# Patient Record
Sex: Female | Born: 1960
Health system: Southern US, Community
[De-identification: ages and names within clinical notes are randomized; demographics above are authoritative.]

## PROBLEM LIST (undated history)

## (undated) DIAGNOSIS — B009 Herpesviral infection, unspecified: Secondary | ICD-10-CM

## (undated) DIAGNOSIS — E785 Hyperlipidemia, unspecified: Secondary | ICD-10-CM

## (undated) DIAGNOSIS — R87619 Unspecified abnormal cytological findings in specimens from cervix uteri: Secondary | ICD-10-CM

## (undated) DIAGNOSIS — F419 Anxiety disorder, unspecified: Secondary | ICD-10-CM

## (undated) DIAGNOSIS — K219 Gastro-esophageal reflux disease without esophagitis: Secondary | ICD-10-CM

## (undated) DIAGNOSIS — I10 Essential (primary) hypertension: Secondary | ICD-10-CM

## (undated) DIAGNOSIS — R040 Epistaxis: Secondary | ICD-10-CM

## (undated) DIAGNOSIS — IMO0002 Reserved for concepts with insufficient information to code with codable children: Secondary | ICD-10-CM

## (undated) DIAGNOSIS — D649 Anemia, unspecified: Secondary | ICD-10-CM

## (undated) DIAGNOSIS — T7840XA Allergy, unspecified, initial encounter: Secondary | ICD-10-CM

## (undated) HISTORY — PX: OTHER SURGICAL HISTORY: SHX169

## (undated) HISTORY — DX: Anemia, unspecified: D64.9

## (undated) HISTORY — DX: Allergy, unspecified, initial encounter: T78.40XA

## (undated) HISTORY — DX: Gastro-esophageal reflux disease without esophagitis: K21.9

## (undated) HISTORY — DX: Hyperlipidemia, unspecified: E78.5

## (undated) HISTORY — PX: CHOLECYSTECTOMY: SHX55

## (undated) HISTORY — DX: Reserved for concepts with insufficient information to code with codable children: IMO0002

## (undated) HISTORY — DX: Anxiety disorder, unspecified: F41.9

## (undated) HISTORY — PX: TUBAL LIGATION: SHX77

## (undated) HISTORY — DX: Unspecified abnormal cytological findings in specimens from cervix uteri: R87.619

## (undated) HISTORY — DX: Herpesviral infection, unspecified: B00.9

---

## 2004-06-02 ENCOUNTER — Ambulatory Visit (HOSPITAL_COMMUNITY): Admission: RE | Admit: 2004-06-02 | Discharge: 2004-06-02 | Payer: Self-pay | Admitting: Obstetrics & Gynecology

## 2004-09-14 ENCOUNTER — Ambulatory Visit: Payer: Self-pay | Admitting: Cardiology

## 2004-10-28 ENCOUNTER — Ambulatory Visit: Payer: Self-pay | Admitting: Cardiology

## 2004-11-25 ENCOUNTER — Ambulatory Visit: Payer: Self-pay | Admitting: Cardiology

## 2009-09-10 ENCOUNTER — Other Ambulatory Visit: Admission: RE | Admit: 2009-09-10 | Discharge: 2009-09-10 | Payer: Self-pay | Admitting: Obstetrics & Gynecology

## 2010-06-24 ENCOUNTER — Ambulatory Visit (HOSPITAL_COMMUNITY)
Admission: RE | Admit: 2010-06-24 | Discharge: 2010-06-24 | Payer: Self-pay | Source: Home / Self Care | Attending: Obstetrics & Gynecology | Admitting: Obstetrics & Gynecology

## 2010-08-09 ENCOUNTER — Encounter: Payer: Self-pay | Admitting: Obstetrics & Gynecology

## 2010-10-29 ENCOUNTER — Other Ambulatory Visit: Payer: Self-pay | Admitting: Obstetrics & Gynecology

## 2010-10-29 ENCOUNTER — Other Ambulatory Visit (HOSPITAL_COMMUNITY)
Admission: RE | Admit: 2010-10-29 | Discharge: 2010-10-29 | Disposition: A | Discharge status: Home / Self Care | Payer: Self-pay | Source: Ambulatory Visit | Attending: Obstetrics & Gynecology | Admitting: Obstetrics & Gynecology

## 2010-10-29 DIAGNOSIS — Z01419 Encounter for gynecological examination (general) (routine) without abnormal findings: Secondary | ICD-10-CM | POA: Insufficient documentation

## 2011-08-25 ENCOUNTER — Other Ambulatory Visit: Payer: Self-pay | Admitting: Obstetrics & Gynecology

## 2011-08-25 DIAGNOSIS — Z139 Encounter for screening, unspecified: Secondary | ICD-10-CM

## 2011-08-26 ENCOUNTER — Ambulatory Visit (HOSPITAL_COMMUNITY)
Admission: RE | Admit: 2011-08-26 | Discharge: 2011-08-26 | Disposition: A | Discharge status: Home / Self Care | Payer: Self-pay | Source: Ambulatory Visit | Attending: Obstetrics & Gynecology | Admitting: Obstetrics & Gynecology

## 2011-08-26 DIAGNOSIS — Z1231 Encounter for screening mammogram for malignant neoplasm of breast: Secondary | ICD-10-CM | POA: Insufficient documentation

## 2011-08-26 DIAGNOSIS — Z139 Encounter for screening, unspecified: Secondary | ICD-10-CM

## 2011-11-11 ENCOUNTER — Other Ambulatory Visit: Payer: Self-pay | Admitting: Obstetrics & Gynecology

## 2011-11-11 ENCOUNTER — Other Ambulatory Visit (HOSPITAL_COMMUNITY)
Admission: RE | Admit: 2011-11-11 | Discharge: 2011-11-11 | Disposition: A | Discharge status: Home / Self Care | Payer: Self-pay | Source: Ambulatory Visit | Attending: Obstetrics & Gynecology | Admitting: Obstetrics & Gynecology

## 2011-11-11 DIAGNOSIS — Z01419 Encounter for gynecological examination (general) (routine) without abnormal findings: Secondary | ICD-10-CM | POA: Insufficient documentation

## 2011-11-18 ENCOUNTER — Ambulatory Visit (INDEPENDENT_AMBULATORY_CARE_PROVIDER_SITE_OTHER): Payer: Self-pay | Admitting: Otolaryngology

## 2012-05-11 ENCOUNTER — Ambulatory Visit (INDEPENDENT_AMBULATORY_CARE_PROVIDER_SITE_OTHER): Payer: Self-pay | Admitting: Otolaryngology

## 2012-05-11 DIAGNOSIS — R040 Epistaxis: Secondary | ICD-10-CM

## 2012-05-22 ENCOUNTER — Encounter (HOSPITAL_COMMUNITY): Payer: Self-pay | Admitting: Emergency Medicine

## 2012-05-22 ENCOUNTER — Emergency Department (HOSPITAL_COMMUNITY): Payer: Self-pay

## 2012-05-22 ENCOUNTER — Emergency Department (HOSPITAL_COMMUNITY)
Admission: EM | Admit: 2012-05-22 | Discharge: 2012-05-22 | Disposition: A | Discharge status: Home / Self Care | Payer: Self-pay | Attending: Emergency Medicine | Admitting: Emergency Medicine

## 2012-05-22 DIAGNOSIS — R040 Epistaxis: Secondary | ICD-10-CM | POA: Insufficient documentation

## 2012-05-22 DIAGNOSIS — E871 Hypo-osmolality and hyponatremia: Secondary | ICD-10-CM | POA: Insufficient documentation

## 2012-05-22 DIAGNOSIS — F17200 Nicotine dependence, unspecified, uncomplicated: Secondary | ICD-10-CM | POA: Insufficient documentation

## 2012-05-22 DIAGNOSIS — I10 Essential (primary) hypertension: Secondary | ICD-10-CM | POA: Insufficient documentation

## 2012-05-22 HISTORY — DX: Epistaxis: R04.0

## 2012-05-22 HISTORY — DX: Essential (primary) hypertension: I10

## 2012-05-22 LAB — CBC WITH DIFFERENTIAL/PLATELET
Basophils Absolute: 0 10*3/uL (ref 0.0–0.1)
Basophils Relative: 1 % (ref 0–1)
Eosinophils Absolute: 0.1 10*3/uL (ref 0.0–0.7)
Eosinophils Relative: 2 % (ref 0–5)
HCT: 39.8 % (ref 36.0–46.0)
Hemoglobin: 13.6 g/dL (ref 12.0–15.0)
Lymphocytes Relative: 23 % (ref 12–46)
Lymphs Abs: 1.9 10*3/uL (ref 0.7–4.0)
MCH: 30.6 pg (ref 26.0–34.0)
MCHC: 34.2 g/dL (ref 30.0–36.0)
MCV: 89.6 fL (ref 78.0–100.0)
Monocytes Absolute: 1.1 10*3/uL — ABNORMAL HIGH (ref 0.1–1.0)
Monocytes Relative: 13 % — ABNORMAL HIGH (ref 3–12)
Neutro Abs: 5.1 10*3/uL (ref 1.7–7.7)
Neutrophils Relative %: 61 % (ref 43–77)
Platelets: 377 10*3/uL (ref 150–400)
RBC: 4.44 MIL/uL (ref 3.87–5.11)
RDW: 12.2 % (ref 11.5–15.5)
WBC: 8.2 10*3/uL (ref 4.0–10.5)

## 2012-05-22 LAB — URINALYSIS, ROUTINE W REFLEX MICROSCOPIC
Bilirubin Urine: NEGATIVE
Glucose, UA: NEGATIVE mg/dL
Ketones, ur: NEGATIVE mg/dL
Leukocytes, UA: NEGATIVE
Nitrite: NEGATIVE
Protein, ur: NEGATIVE mg/dL
Specific Gravity, Urine: 1.025 (ref 1.005–1.030)
Urobilinogen, UA: 0.2 mg/dL (ref 0.0–1.0)
pH: 5.5 (ref 5.0–8.0)

## 2012-05-22 LAB — TSH: TSH: 1.373 u[IU]/mL (ref 0.350–4.500)

## 2012-05-22 LAB — BASIC METABOLIC PANEL
BUN: 16 mg/dL (ref 6–23)
CO2: 27 mEq/L (ref 19–32)
Calcium: 9.3 mg/dL (ref 8.4–10.5)
Chloride: 87 mEq/L — ABNORMAL LOW (ref 96–112)
Creatinine, Ser: 0.57 mg/dL (ref 0.50–1.10)
GFR calc Af Amer: >90 mL/min (ref >90)
GFR calc non Af Amer: >90 mL/min (ref >90)
Glucose, Bld: 106 mg/dL — ABNORMAL HIGH (ref 70–99)
Potassium: 3.5 mEq/L (ref 3.5–5.1)
Sodium: 124 mEq/L — ABNORMAL LOW (ref 135–145)

## 2012-05-22 LAB — TROPONIN I: Troponin I: <0.3 ng/mL (ref <0.30)

## 2012-05-22 LAB — URINE MICROSCOPIC-ADD ON

## 2012-05-22 MED ORDER — ACETAMINOPHEN 325 MG PO TABS
650.0000 mg | ORAL_TABLET | Freq: Once | ORAL | Status: AC
  Administered 2012-05-22: 650 mg via ORAL
  Filled 2012-05-22: qty 2

## 2012-05-22 MED ORDER — METOPROLOL SUCCINATE ER 50 MG PO TB24: 50.0000 mg | ORAL_TABLET | Freq: Every day | ORAL | Status: DC

## 2012-05-22 NOTE — ED Notes (Signed)
Pt has had problems with epistaxis x 7 months. Recently dx with hypertension and on new med. Had 50cent piece clots out of r side. Pt c/o gen weakness and just feeling tired all the time. Sob intermittant. No resp distress noted. Pt pale per daughter. No gen weakness noted. Alert/roiented.

## 2012-05-22 NOTE — ED Provider Notes (Signed)
History     CSN: [id number]  Arrival date & time 05/22/12  1700   First MD Initiated Contact with Patient 05/22/12 1704      Chief Complaint  Patient presents with  . Weakness    (Consider location/radiation/quality/duration/timing/severity/associated sxs/prior treatment) HPI.... weakness and fatigue for 2 weeks. Patient has had hypertension for 2 years but just started prescription medicine. She was initially on verapamil but this did not agree with her. She was changed to lisinopril hydrochlorothiazide 20/12.5 since past Thursday.  Her symptoms have intensified since then.  smokes half a pack per day. No alcohol. No chest pain or dyspnea.  No fever, sweats, chills, or weight loss.  Has recently seen otolaryngologists for her nosebleeds on the right side.  Past Medical History  Diagnosis Date  . Hypertension   . Epistaxis     Past Surgical History  Procedure Date  . Tubal ligation   .  funduplication     History reviewed. No pertinent family history.  History  Substance Use Topics  . Smoking status: Current Every Day Smoker  . Smokeless tobacco: Not on file  . Alcohol Use: No    OB History    Grav Para Term Preterm Abortions TAB SAB Ect Mult Living                  Review of Systems  All other systems reviewed and are negative.    Allergies  Ivp dye  Home Medications   Current Outpatient Rx  Name  Route  Sig  Dispense  Refill  . LISINOPRIL-HYDROCHLOROTHIAZIDE 20-12.5 MG PO TABS   Oral   Take 1 tablet by mouth every evening. *Takes at 5pm every evening         . SALINE NASAL MIST NA   Nasal   Place 1-2 sprays into the nose daily as needed. As needed for nose bleeds           BP 161/92  Pulse 95  Temp 97.7 F (36.5 C) (Oral)  Resp 17  Ht 5\' 4"  (1.626 m)  Wt 112 lb (50.803 kg)  BMI 19.22 kg/m2  SpO2 99%  Physical Exam  Nursing note and vitals reviewed. Constitutional: She is oriented to person, place, and time. She appears well-developed  and well-nourished.       Hypertensive  HENT:  Head: Normocephalic and atraumatic.  Eyes: Conjunctivae normal and EOM are normal. Pupils are equal, round, and reactive to light.  Neck: Normal range of motion. Neck supple.  Cardiovascular: Normal rate, regular rhythm and normal heart sounds.   Pulmonary/Chest: Effort normal and breath sounds normal.  Abdominal: Soft. Bowel sounds are normal.  Musculoskeletal: Normal range of motion.  Neurological: She is alert and oriented to person, place, and time.  Skin: Skin is warm and dry.  Psychiatric: She has a normal mood and affect.    ED Course  Procedures (including critical care time)  Labs Reviewed  CBC WITH DIFFERENTIAL - Abnormal; Notable for the following:    Monocytes Relative 13 (*)     Monocytes Absolute 1.1 (*)     All other components within normal limits  BASIC METABOLIC PANEL - Abnormal; Notable for the following:    Sodium 124 (*)     Chloride 87 (*)     Glucose, Bld 106 (*)     All other components within normal limits  URINALYSIS, ROUTINE W REFLEX MICROSCOPIC - Abnormal; Notable for the following:    Hgb urine dipstick MODERATE (*)  All other components within normal limits  URINE MICROSCOPIC-ADD ON - Abnormal; Notable for the following:    Squamous Epithelial / LPF FEW (*)     All other components within normal limits  TROPONIN I  TSH   Dg Chest 2 View  05/22/2012  *RADIOLOGY REPORT*  Clinical Data: 51 year old female with weakness.  Hypertension and shortness of breath.  CHEST - 2 VIEW  Comparison: None.  Findings: Large lung volumes.  Cardiac size at the upper limits of normal. Other mediastinal contours are within normal limits. Incidental azygos fissure.  Mild apical scarring.  No pneumothorax, pulmonary edema, pleural effusion or consolidation.  Osteopenia. Right upper quadrant surgical clips.  IMPRESSION: Pulmonary hyperinflation. No acute cardiopulmonary abnormality.   Original Report Authenticated By: Erskine Speed, M.D.      No diagnosis found.   Date: 05/22/2012  Rate: 97  Rhythm: normal sinus rhythm  QRS Axis: normal  Intervals: normal  ST/T Wave abnormalities: normal  Conduction Disutrbances: none  Narrative Interpretation: unremarkable     MDM  Screening tests negative with the exception of hyponatremia. Sodium was 124.  I suspect her blood pressure medication is contributing to this problem.   I recommended discontinuation of lisinopril/hydrochlorothiazide and changed to metoprolol extended release 50 mg daily.        Donnetta Hutching, MD 05/22/12 2042

## 2012-09-02 ENCOUNTER — Other Ambulatory Visit: Payer: Self-pay

## 2012-10-10 ENCOUNTER — Encounter: Payer: Self-pay | Admitting: Gastroenterology

## 2012-10-10 ENCOUNTER — Encounter (HOSPITAL_COMMUNITY): Payer: Self-pay | Admitting: Pharmacy Technician

## 2012-10-10 ENCOUNTER — Ambulatory Visit (INDEPENDENT_AMBULATORY_CARE_PROVIDER_SITE_OTHER): Payer: Self-pay | Admitting: Gastroenterology

## 2012-10-10 VITALS — BP 145/90 | HR 85 | Temp 97.6°F | Ht 64.0 in | Wt 123.6 lb

## 2012-10-10 DIAGNOSIS — K5900 Constipation, unspecified: Secondary | ICD-10-CM

## 2012-10-10 DIAGNOSIS — R141 Gas pain: Secondary | ICD-10-CM

## 2012-10-10 DIAGNOSIS — R140 Abdominal distension (gaseous): Secondary | ICD-10-CM

## 2012-10-10 DIAGNOSIS — R198 Other specified symptoms and signs involving the digestive system and abdomen: Secondary | ICD-10-CM

## 2012-10-10 DIAGNOSIS — R194 Change in bowel habit: Secondary | ICD-10-CM

## 2012-10-10 MED ORDER — ALIGN 4 MG PO CAPS: 4.0000 mg | ORAL_CAPSULE | Freq: Every day | ORAL | Status: DC

## 2012-10-10 MED ORDER — POLYETHYLENE GLYCOL 3350 17 GM/SCOOP PO POWD: 17.0000 g | ORAL | Status: DC

## 2012-10-10 NOTE — Progress Notes (Signed)
Faxed to PCP

## 2012-10-10 NOTE — Assessment & Plan Note (Addendum)
Bowel change worsening over the past six months. Goes 2-3 weeks without BM. C/O associated abdominal bloating/swelling. Thyroid labs okay back in 05/2012. No evidence of anemia at that time. No prior colonoscopy. Recommend colonoscopy to evaluate change in bowels. Start Miralax 17g QD to BID. She can take Dulcolax 10mg  PO, repeat three hours later and Miralax 17g every hour times five for colon purge today. Start Restora one daily for three weeks, samples provided.   Colonoscopy with Prepopik in near future.  I have discussed the risks, alternatives, benefits with regards to but not limited to the risk of reaction to medication, bleeding, infection, perforation and the patient is agreeable to proceed. Written consent to be obtained.  Retrieve copy of last EGD for records.

## 2012-10-10 NOTE — Progress Notes (Signed)
Primary Care Physician:  Toma Deiters, MD  Primary Gastroenterologist:  Roetta Sessions, MD   Chief Complaint  Patient presents with  . Abdominal Pain    HPI:  Suzanne Morton is a 52 y.o. female here for further evaluation of abdominal swelling/constipation. Seen previously by Dr. Jena Gauss over ten years ago. Records from hospital requested. Patient reports prior EGD, esophageal manometry and fundoplication. Denies prior colonoscopy.  C/O change in bowels over past couple of years. Really bad the last 6 months. Rarely has diarrhea. Usually goes 2-3 weeks without BMS. Stomach makes lot of noise. C/O fatigue and headaches. Went to pharmacy and was advised to take Miralax. Took two capfuls BID for two days so far with small amount of results. No melena, brbpr. C/O bloating that is worse as the day progresses. No belching since fundoplication, no vomiting. No dysphagia. No heartburn, no melena, brbpr, no weight loss. Rarely uses NSAIDs/ASA.   Current Outpatient Prescriptions  Medication Sig Dispense Refill  . metoprolol succinate (TOPROL-XL) 50 MG 24 hr tablet Take 1 tablet (50 mg total) by mouth daily. Take with or immediately following a meal.  30 tablet  1  . SALINE NASAL MIST NA Place 1-2 sprays into the nose daily as needed. As needed for nose bleeds      . lisinopril-hydrochlorothiazide (PRINZIDE,ZESTORETIC) 20-12.5 MG per tablet Take 1 tablet by mouth every evening. *Takes at 5pm every evening       No current facility-administered medications for this visit.    Allergies as of 10/10/2012 - Review Complete 10/10/2012  Allergen Reaction Noted  . Ivp dye (iodinated diagnostic agents) Hives, Shortness Of Breath, and Swelling 05/22/2012    Past Medical History  Diagnosis Date  . Hypertension   . Epistaxis     Past Surgical History  Procedure Laterality Date  . Tubal ligation    .  funduplication    . Cholecystectomy      Dr. Leona Carry    Family History  Problem Relation Age of  Onset  . Colon cancer Neg Hx   . Lung cancer Mother   . Alcoholism Father     History   Social History  . Marital Status: Divorced    Spouse Name: N/A    Number of Children: 2  . Years of Education: N/A   Occupational History  . Medical illustrator    Social History Main Topics  . Smoking status: Current Every Day Smoker  . Smokeless tobacco: Not on file  . Alcohol Use: No  . Drug Use: No  . Sexually Active:    Other Topics Concern  . Not on file   Social History Narrative  . No narrative on file      ROS:  General: Negative for anorexia, weight loss, fever, chills. C/O fatigue. Eyes: Negative for vision changes.  ENT: Negative for hoarseness, difficulty swallowing , nasal congestion. CV: Negative for chest pain, angina, palpitations, dyspnea on exertion, peripheral edema.  Respiratory: Negative for dyspnea at rest, dyspnea on exertion, cough, sputum, wheezing.  GI: See history of present illness. GU:  Negative for dysuria, hematuria, urinary incontinence, urinary frequency, nocturnal urination.  MS: Negative for joint pain, low back pain.  Derm: Negative for rash or itching.  Neuro: Negative for weakness, abnormal sensation, seizure, frequent headaches, memory loss, confusion.  Psych: Negative for anxiety, depression, suicidal ideation, hallucinations.  Endo: Negative for unusual weight change.  Heme: Negative for bruising or bleeding. Allergy: Negative for rash or hives.  Physical Examination:  BP 145/90  Pulse 85  Temp(Src) 97.6 F (36.4 C) (Oral)  Ht 5\' 4"  (1.626 m)  Wt 123 lb 9.6 oz (56.065 kg)  BMI 21.21 kg/m2   General: Well-nourished, well-developed in no acute distress.  Head: Normocephalic, atraumatic.   Eyes: Conjunctiva pink, no icterus. Mouth: Oropharyngeal mucosa moist and pink , no lesions erythema or exudate. Neck: Supple without thyromegaly, masses, or lymphadenopathy.  Lungs: Clear to auscultation bilaterally.  Heart: Regular  rate and rhythm, no murmurs rubs or gallops.  Abdomen: Bowel sounds are normal, nontender, nondistended, no hepatosplenomegaly or masses, no abdominal bruits or hernia , no rebound or guarding.   Rectal: not performed Extremities: No lower extremity edema. No clubbing or deformities.  Neuro: Alert and oriented x 4 , grossly normal neurologically.  Skin: Warm and dry, no rash or jaundice.   Psych: Alert and cooperative, normal mood and affect.  Labs: Lab Results  Component Value Date   TSH 1.373 05/22/2012   Lab Results  Component Value Date   CREATININE 0.57 05/22/2012   BUN 16 05/22/2012   NA 124* 05/22/2012   K 3.5 05/22/2012   CL 87* 05/22/2012   CO2 27 05/22/2012   Lab Results  Component Value Date   WBC 8.2 05/22/2012   HGB 13.6 05/22/2012   HCT 39.8 05/22/2012   MCV 89.6 05/22/2012   PLT 377 05/22/2012    Imaging Studies: No results found.

## 2012-10-10 NOTE — Patient Instructions (Addendum)
In the next one to 2 days at your convenience, performed the following bowel regimen. #1 Dulcolax, take 2 tablets. #2 One hour later begin MiraLax 1 cap full every hour for 5 hours. #3 After your third dose of MiraLax, take 2 additional Dulcolax tablets. #4 Continue MiraLax one capful once or twice daily as needed.  We have scheduled you for a colonoscopy. We have provided you with a sample of Prepopik to take before this procedure. Please fill out patient assistance for as soon as possible.  Take Align (probiotic) once daily for three weeks. Samples provided.

## 2012-10-23 ENCOUNTER — Encounter (HOSPITAL_COMMUNITY)
Admission: RE | Disposition: A | Discharge status: Home / Self Care | Payer: Self-pay | Source: Ambulatory Visit | Attending: Internal Medicine

## 2012-10-23 ENCOUNTER — Ambulatory Visit (HOSPITAL_COMMUNITY)
Admission: RE | Admit: 2012-10-23 | Discharge: 2012-10-23 | Disposition: A | Discharge status: Home / Self Care | Payer: Self-pay | Source: Ambulatory Visit | Attending: Internal Medicine | Admitting: Internal Medicine

## 2012-10-23 ENCOUNTER — Encounter (HOSPITAL_COMMUNITY): Payer: Self-pay

## 2012-10-23 ENCOUNTER — Encounter: Payer: Self-pay | Admitting: *Deleted

## 2012-10-23 DIAGNOSIS — F17200 Nicotine dependence, unspecified, uncomplicated: Secondary | ICD-10-CM | POA: Insufficient documentation

## 2012-10-23 DIAGNOSIS — K5900 Constipation, unspecified: Secondary | ICD-10-CM

## 2012-10-23 DIAGNOSIS — B009 Herpesviral infection, unspecified: Secondary | ICD-10-CM

## 2012-10-23 DIAGNOSIS — I10 Essential (primary) hypertension: Secondary | ICD-10-CM | POA: Insufficient documentation

## 2012-10-23 DIAGNOSIS — Z91041 Radiographic dye allergy status: Secondary | ICD-10-CM | POA: Insufficient documentation

## 2012-10-23 DIAGNOSIS — R194 Change in bowel habit: Secondary | ICD-10-CM

## 2012-10-23 DIAGNOSIS — Z79899 Other long term (current) drug therapy: Secondary | ICD-10-CM | POA: Insufficient documentation

## 2012-10-23 HISTORY — PX: COLONOSCOPY: SHX5424

## 2012-10-23 SURGERY — COLONOSCOPY
Anesthesia: Moderate Sedation

## 2012-10-23 MED ORDER — MIDAZOLAM HCL 5 MG/5ML IJ SOLN
INTRAMUSCULAR | Status: DC | PRN
  Administered 2012-10-23: 1 mg via INTRAVENOUS
  Administered 2012-10-23: 2 mg via INTRAVENOUS
  Administered 2012-10-23: 1 mg via INTRAVENOUS

## 2012-10-23 MED ORDER — MIDAZOLAM HCL 5 MG/5ML IJ SOLN
INTRAMUSCULAR | Status: AC
  Filled 2012-10-23: qty 10

## 2012-10-23 MED ORDER — ONDANSETRON HCL 4 MG/2ML IJ SOLN
INTRAMUSCULAR | Status: DC | PRN
  Administered 2012-10-23: 4 mg via INTRAVENOUS

## 2012-10-23 MED ORDER — STERILE WATER FOR IRRIGATION IR SOLN
Status: DC | PRN
  Administered 2012-10-23: 09:00:00

## 2012-10-23 MED ORDER — SODIUM CHLORIDE 0.9 % IV SOLN
INTRAVENOUS | Status: DC
  Administered 2012-10-23: 09:00:00 via INTRAVENOUS

## 2012-10-23 MED ORDER — ONDANSETRON HCL 4 MG/2ML IJ SOLN
INTRAMUSCULAR | Status: AC
  Filled 2012-10-23: qty 2

## 2012-10-23 MED ORDER — MEPERIDINE HCL 100 MG/ML IJ SOLN
INTRAMUSCULAR | Status: AC
  Filled 2012-10-23: qty 2

## 2012-10-23 MED ORDER — MEPERIDINE HCL 100 MG/ML IJ SOLN
INTRAMUSCULAR | Status: DC | PRN
  Administered 2012-10-23: 50 mg via INTRAVENOUS
  Administered 2012-10-23: 25 mg via INTRAVENOUS

## 2012-10-23 NOTE — H&P (View-Only) (Signed)
Primary Care Physician:  HASANAJ,XAJE A, MD  Primary Gastroenterologist:  Michael Rourk, MD   Chief Complaint  Patient presents with  . Abdominal Pain    HPI:  Suzanne Morton is a 51 y.o. female here for further evaluation of abdominal swelling/constipation. Seen previously by Dr. Rourk over ten years ago. Records from hospital requested. Patient reports prior EGD, esophageal manometry and fundoplication. Denies prior colonoscopy.  C/O change in bowels over past couple of years. Really bad the last 6 months. Rarely has diarrhea. Usually goes 2-3 weeks without BMS. Stomach makes lot of noise. C/O fatigue and headaches. Went to pharmacy and was advised to take Miralax. Took two capfuls BID for two days so far with small amount of results. No melena, brbpr. C/O bloating that is worse as the day progresses. No belching since fundoplication, no vomiting. No dysphagia. No heartburn, no melena, brbpr, no weight loss. Rarely uses NSAIDs/ASA.   Current Outpatient Prescriptions  Medication Sig Dispense Refill  . metoprolol succinate (TOPROL-XL) 50 MG 24 hr tablet Take 1 tablet (50 mg total) by mouth daily. Take with or immediately following a meal.  30 tablet  1  . SALINE NASAL MIST NA Place 1-2 sprays into the nose daily as needed. As needed for nose bleeds      . lisinopril-hydrochlorothiazide (PRINZIDE,ZESTORETIC) 20-12.5 MG per tablet Take 1 tablet by mouth every evening. *Takes at 5pm every evening       No current facility-administered medications for this visit.    Allergies as of 10/10/2012 - Review Complete 10/10/2012  Allergen Reaction Noted  . Ivp dye (iodinated diagnostic agents) Hives, Shortness Of Breath, and Swelling 05/22/2012    Past Medical History  Diagnosis Date  . Hypertension   . Epistaxis     Past Surgical History  Procedure Laterality Date  . Tubal ligation    .  funduplication    . Cholecystectomy      Dr. Destefano    Family History  Problem Relation Age of  Onset  . Colon cancer Neg Hx   . Lung cancer Mother   . Alcoholism Father     History   Social History  . Marital Status: Divorced    Spouse Name: N/A    Number of Children: 2  . Years of Education: N/A   Occupational History  . Financial office manager    Social History Main Topics  . Smoking status: Current Every Day Smoker  . Smokeless tobacco: Not on file  . Alcohol Use: No  . Drug Use: No  . Sexually Active:    Other Topics Concern  . Not on file   Social History Narrative  . No narrative on file      ROS:  General: Negative for anorexia, weight loss, fever, chills. C/O fatigue. Eyes: Negative for vision changes.  ENT: Negative for hoarseness, difficulty swallowing , nasal congestion. CV: Negative for chest pain, angina, palpitations, dyspnea on exertion, peripheral edema.  Respiratory: Negative for dyspnea at rest, dyspnea on exertion, cough, sputum, wheezing.  GI: See history of present illness. GU:  Negative for dysuria, hematuria, urinary incontinence, urinary frequency, nocturnal urination.  MS: Negative for joint pain, low back pain.  Derm: Negative for rash or itching.  Neuro: Negative for weakness, abnormal sensation, seizure, frequent headaches, memory loss, confusion.  Psych: Negative for anxiety, depression, suicidal ideation, hallucinations.  Endo: Negative for unusual weight change.  Heme: Negative for bruising or bleeding. Allergy: Negative for rash or hives.      Physical Examination:  BP 145/90  Pulse 85  Temp(Src) 97.6 F (36.4 C) (Oral)  Ht 5' 4" (1.626 m)  Wt 123 lb 9.6 oz (56.065 kg)  BMI 21.21 kg/m2   General: Well-nourished, well-developed in no acute distress.  Head: Normocephalic, atraumatic.   Eyes: Conjunctiva pink, no icterus. Mouth: Oropharyngeal mucosa moist and pink , no lesions erythema or exudate. Neck: Supple without thyromegaly, masses, or lymphadenopathy.  Lungs: Clear to auscultation bilaterally.  Heart: Regular  rate and rhythm, no murmurs rubs or gallops.  Abdomen: Bowel sounds are normal, nontender, nondistended, no hepatosplenomegaly or masses, no abdominal bruits or hernia , no rebound or guarding.   Rectal: not performed Extremities: No lower extremity edema. No clubbing or deformities.  Neuro: Alert and oriented x 4 , grossly normal neurologically.  Skin: Warm and dry, no rash or jaundice.   Psych: Alert and cooperative, normal mood and affect.  Labs: Lab Results  Component Value Date   TSH 1.373 05/22/2012   Lab Results  Component Value Date   CREATININE 0.57 05/22/2012   BUN 16 05/22/2012   NA 124* 05/22/2012   K 3.5 05/22/2012   CL 87* 05/22/2012   CO2 27 05/22/2012   Lab Results  Component Value Date   WBC 8.2 05/22/2012   HGB 13.6 05/22/2012   HCT 39.8 05/22/2012   MCV 89.6 05/22/2012   PLT 377 05/22/2012    Imaging Studies: No results found.    

## 2012-10-23 NOTE — Interval H&P Note (Signed)
History and Physical Interval Note:  10/23/2012 9:22 AM  Suzanne Morton  has presented today for surgery, with the diagnosis of Constipation Change in bowels  The various methods of treatment have been discussed with the patient and family. After consideration of risks, benefits and other options for treatment, the patient has consented to  Procedure(s) with comments: COLONOSCOPY (N/A) - 9:15 as a surgical intervention .  The patient's history has been reviewed, patient examined, no change in status, stable for surgery.  I have reviewed the patient's chart and labs.  Questions were answered to the patient's satisfaction.     Colonoscopy per plan.The risks, benefits, limitations, alternatives and imponderables have been reviewed with the patient. Questions have been answered. All parties are agreeable.   Eula Listen

## 2012-10-23 NOTE — Discharge Summary (Signed)
   Colonoscopy Discharge Instructions  Read the instructions outlined below and refer to this sheet in the next few weeks. These discharge instructions provide you with general information on caring for yourself after you leave the hospital. Your doctor may also give you specific instructions. While your treatment has been planned according to the most current medical practices available, unavoidable complications occasionally occur. If you have any problems or questions after discharge, call Dr. Jena Gauss at [phone]. ACTIVITY  You may resume your regular activity, but move at a slower pace for the next 24 hours.   Take frequent rest periods for the next 24 hours.   Walking will help get rid of the air and reduce the bloated feeling in your belly (abdomen).   No driving for 24 hours (because of the medicine (anesthesia) used during the test).    Do not sign any important legal documents or operate any machinery for 24 hours (because of the anesthesia used during the test).  NUTRITION  Drink plenty of fluids.   You may resume your normal diet as instructed by your doctor.   Begin with a light meal and progress to your normal diet. Heavy or fried foods are harder to digest and may make you feel sick to your stomach (nauseated).   Avoid alcoholic beverages for 24 hours or as instructed.  MEDICATIONS  You may resume your normal medications unless your doctor tells you otherwise.  WHAT YOU CAN EXPECT TODAY  Some feelings of bloating in the abdomen.   Passage of more gas than usual.   Spotting of blood in your stool or on the toilet paper.  IF YOU HAD POLYPS REMOVED DURING THE COLONOSCOPY:  No aspirin products for 7 days or as instructed.   No alcohol for 7 days or as instructed.   Eat a soft diet for the next 24 hours.  FINDING OUT THE RESULTS OF YOUR TEST Not all test results are available during your visit. If your test results are not back during the visit, make an appointment  with your caregiver to find out the results. Do not assume everything is normal if you have not heard from your caregiver or the medical facility. It is important for you to follow up on all of your test results.  SEEK IMMEDIATE MEDICAL ATTENTION IF:  You have more than a spotting of blood in your stool.   Your belly is swollen (abdominal distention).   You are nauseated or vomiting.   You have a temperature over 101.   You have abdominal pain or discomfort that is severe or gets worse throughout the day.   Constipation information provided  Use MiraLax 34 g daily either as a single dose or 17 g orally twice daily to achieve a bowel movement every other day or 3 times weekly.  Repeat screening colonoscopy in 10 years

## 2012-10-23 NOTE — Op Note (Signed)
[facility] [address] [place], [address]   COLONOSCOPY PROCEDURE REPORT  PATIENT: Suzanne Morton, Suzanne Morton  MR#:         [id number] BIRTHDATE: 1960/09/10 , 52  yrs. old GENDER: Female ENDOSCOPIST: R.  Roetta Sessions, MD FACP FACG REFERRED BY:  Lia Hopping, M.D. PROCEDURE DATE:  10/23/2012 PROCEDURE:     screening ileo-colonoscopy  INDICATIONS:   Average risk screening examination/constipation  INFORMED CONSENT:  The risks, benefits, alternatives and imponderables including but not limited to bleeding, perforation as well as the possibility of a missed lesion have been reviewed.  The potential for biopsy, lesion removal, etc. have also been discussed.  Questions have been answered.  All parties agreeable. Please see the history and physical in the medical record for more information.  MEDICATIONS: Versed 5 mg IV and Demerol 75 mg IV in divided doses. Zofran 4 mg IV  DESCRIPTION OF PROCEDURE:  After a digital rectal exam was performed, the EC-[id number] ([id number])  colonoscope was advanced from the anus through the rectum and colon to the area of the cecum, ileocecal valve and appendiceal orifice.  The cecum was deeply intubated.  These structures were well-seen and photographed for the record.  From the level of the cecum and ileocecal valve, the scope was slowly and cautiously withdrawn.  The mucosal surfaces were carefully surveyed utilizing scope tip deflection to facilitate fold flattening as needed.  The scope was pulled down into the rectum where a thorough examination including retroflexion was performed.    FINDINGS:  Adequate preparation.  Anal papilla; otherwise, normal rectum. Normal-appearing colonic mucosa appeared Normal-appearing distal 10 cm of terminal ileal mucosa  THERAPEUTIC / DIAGNOSTIC MANEUVERS PERFORMED:  None  COMPLICATIONS: None  CECAL WITHDRAWAL TIME:  8 minutes  IMPRESSION:  Normal rectum (anal papilla), colon and terminal  ileum  RECOMMENDATIONS: Continue bowel regimen previously recommended. Specifically, would increase MiraLax to 34 g once daily or 17 g orally twice daily to achieve at least 3 bowel movements weekly or one bowel movement every other day.   _______________________________ eSigned:  R. Roetta Sessions, MD FACP [facility] 10/23/2012 10:03 AM   CC:

## 2012-10-24 ENCOUNTER — Ambulatory Visit: Payer: Self-pay | Admitting: Obstetrics & Gynecology

## 2012-10-26 ENCOUNTER — Encounter (HOSPITAL_COMMUNITY): Payer: Self-pay | Admitting: Internal Medicine

## 2012-11-22 ENCOUNTER — Other Ambulatory Visit: Payer: Self-pay | Admitting: Obstetrics & Gynecology

## 2012-11-28 ENCOUNTER — Encounter: Payer: Self-pay | Admitting: Obstetrics & Gynecology

## 2012-11-28 ENCOUNTER — Ambulatory Visit (INDEPENDENT_AMBULATORY_CARE_PROVIDER_SITE_OTHER): Payer: Self-pay | Admitting: Obstetrics & Gynecology

## 2012-11-28 VITALS — BP 100/70 | Ht 63.0 in | Wt 123.0 lb

## 2012-11-28 DIAGNOSIS — Z01419 Encounter for gynecological examination (general) (routine) without abnormal findings: Secondary | ICD-10-CM

## 2012-11-28 DIAGNOSIS — Z1212 Encounter for screening for malignant neoplasm of rectum: Secondary | ICD-10-CM

## 2012-11-28 NOTE — Patient Instructions (Signed)

## 2012-11-28 NOTE — Progress Notes (Signed)
Patient ID: Suzanne Morton, female   DOB: [birth year], 52 y.o.   MRN: [id number] Subjective:     Suzanne Morton is a 52 y.o. female here for a routine exam.  No LMP recorded. Patient is postmenopausal. G2P2 Current complaints: none.  Personal health questionnaire reviewed: yes.   Gynecologic History No LMP recorded. Patient is postmenopausal. Contraception: post menopausal status Last Pap: 2013. Results were: normal Last mammogram: 2013. Results were: normal  Obstetric History OB History   Grav Para Term Preterm Abortions TAB SAB Ect Mult Living   2 2        2      # Outc Date GA Lbr Len/2nd Wgt Sex Del Anes PTL Lv   1 PAR      SVD   Yes   2 PAR      SVD   Yes       The following portions of the patient's history were reviewed and updated as appropriate: allergies, current medications, past family history, past medical history, past social history, past surgical history and problem list.  Review of Systems  Review of Systems  Constitutional: Negative for fever, chills, weight loss, malaise/fatigue and diaphoresis.  HENT: Negative for hearing loss, ear pain, nosebleeds, congestion, sore throat, neck pain, tinnitus and ear discharge.   Eyes: Negative for blurred vision, double vision, photophobia, pain, discharge and redness.  Respiratory: Negative for cough, hemoptysis, sputum production, shortness of breath, wheezing and stridor.   Cardiovascular: Negative for chest pain, palpitations, orthopnea, claudication, leg swelling and PND.  Gastrointestinal: negative for abdominal pain. Negative for heartburn, nausea, vomiting, diarrhea, constipation, blood in stool and melena.  Genitourinary: Negative for dysuria, urgency, frequency, hematuria and flank pain.  Musculoskeletal: Negative for myalgias, back pain, joint pain and falls.  Skin: Negative for itching and rash.  Neurological: Negative for dizziness, tingling, tremors, sensory change, speech change, focal weakness, seizures, loss of  consciousness, weakness and headaches.  Endo/Heme/Allergies: Negative for environmental allergies and polydipsia. Does not bruise/bleed easily.  Psychiatric/Behavioral: Negative for depression, suicidal ideas, hallucinations, memory loss and substance abuse. The patient is not nervous/anxious and does not have insomnia.        Objective:    Physical Exam  Vitals reviewed. Constitutional: She is oriented to person, place, and time. She appears well-developed and well-nourished.  HENT:  Head: Normocephalic and atraumatic.        Right Ear: External ear normal.  Left Ear: External ear normal.  Nose: Nose normal.  Mouth/Throat: Oropharynx is clear and moist.  Eyes: Conjunctivae and EOM are normal. Pupils are equal, round, and reactive to light. Right eye exhibits no discharge. Left eye exhibits no discharge. No scleral icterus.  Neck: Normal range of motion. Neck supple. No tracheal deviation present. No thyromegaly present.  Cardiovascular: Normal rate, regular rhythm, normal heart sounds and intact distal pulses.  Exam reveals no gallop and no friction rub.   No murmur heard. Respiratory: Effort normal and breath sounds normal. No respiratory distress. She has no wheezes. She has no rales. She exhibits no tenderness.  GI: Soft. Bowel sounds are normal. She exhibits no distension and no mass. There is no tenderness. There is no rebound and no guarding.  Genitourinary:       Vulva is normal without lesions Vagina is pink moist without discharge Cervix normal in appearance and pap is done Uterus is normal size shape and contour Adnexa is negative with normal sized ovaries  Rectal no masses heme occult negative normal tone  Musculoskeletal: Normal range of motion. She exhibits no edema and no tenderness.  Neurological: She is alert and oriented to person, place, and time. She has normal reflexes. She displays normal reflexes. No cranial nerve deficit. She exhibits normal muscle tone.  Coordination normal.  Skin: Skin is warm and dry. No rash noted. No erythema. No pallor.  Psychiatric: She has a normal mood and affect. Her behavior is normal. Judgment and thought content normal.       Assessment:    Healthy female exam.    Plan:    Follow up in: 1 year.

## 2012-12-07 ENCOUNTER — Telehealth: Payer: Self-pay | Admitting: Internal Medicine

## 2012-12-07 NOTE — Telephone Encounter (Signed)
Could try course of Linzess 290 mcg capsules one a day x14-samples - okay.

## 2012-12-07 NOTE — Telephone Encounter (Signed)
Pt aware, samples and pt assistance forms at the front desk. Pt will fill out paperwork and bring it back if the medication works well for her.

## 2012-12-07 NOTE — Telephone Encounter (Signed)
Pt called to say that she is still having stomach troubles since having her tcs. She said the restora samples are not helping her and she is drinking 4 cups of metamucil a day. Please advise and call her at [phone]

## 2012-12-07 NOTE — Telephone Encounter (Signed)
Spoke with pt- she is taking miralax 4 x a day (not metamucil) and having very little results. She has a lot of gas and swelling of her stomach and a little bit of nausea but she doesn't feel any pressure in her rectum like she would need to have a BM. Her stools have been very small. She is drinking plenty of fluids. Pt stated that she is miserable. She wants to know if there is anything she can do. She doesn't have any Rx insurance.

## 2012-12-12 ENCOUNTER — Telehealth: Payer: Self-pay

## 2012-12-12 NOTE — Telephone Encounter (Signed)
She likely needs both Miralax and Linzess due to the severity of her constipation. It is too early to call Linzess a failure.  If she is willing, I would recommend she continue Linzess and add back Miralax two capfuls per day.   Otherwise, would offer her amitiza BID with food and continue Miralax two capfuls per day. Offer her samples for two weeks.

## 2012-12-12 NOTE — Telephone Encounter (Signed)
She is already doing Miralax four times a day with the Linzess, Please advise

## 2012-12-12 NOTE — Telephone Encounter (Signed)
Pt called to let us know that the Linzess is making her stomach cramp. She had a BM yesterday but very little her last good BM was 5 days ago. I told her to hold the medication for now. She feels very bloated. Please advise

## 2012-12-13 NOTE — Telephone Encounter (Signed)
Pt is aware to come by and get samples.

## 2012-12-13 NOTE — Telephone Encounter (Signed)
Tried to call with no answer  

## 2012-12-13 NOTE — Telephone Encounter (Signed)
She should not be taking four doses of Miralax daily on regular basis.   Continue Miralax BID or two capfuls ONCE daily. Switch to Amitiza BID with food. Give two weeks of samples. STOP Linzess.

## 2012-12-19 ENCOUNTER — Telehealth: Payer: Self-pay | Admitting: Obstetrics & Gynecology

## 2012-12-19 DIAGNOSIS — Z01419 Encounter for gynecological examination (general) (routine) without abnormal findings: Secondary | ICD-10-CM

## 2012-12-19 NOTE — Telephone Encounter (Signed)
Pt notified of WNL pap from 11/28/2012

## 2013-01-06 NOTE — Progress Notes (Signed)
REVIEWED.  

## 2013-04-12 ENCOUNTER — Encounter: Payer: Self-pay | Admitting: Internal Medicine

## 2013-04-12 ENCOUNTER — Other Ambulatory Visit: Payer: Self-pay | Admitting: Internal Medicine

## 2013-04-12 ENCOUNTER — Ambulatory Visit (INDEPENDENT_AMBULATORY_CARE_PROVIDER_SITE_OTHER): Payer: Self-pay | Admitting: Internal Medicine

## 2013-04-12 VITALS — BP 148/81 | HR 74 | Temp 98.4°F | Ht 64.0 in | Wt 122.2 lb

## 2013-04-12 DIAGNOSIS — R1011 Right upper quadrant pain: Secondary | ICD-10-CM

## 2013-04-12 DIAGNOSIS — G8929 Other chronic pain: Secondary | ICD-10-CM

## 2013-04-12 DIAGNOSIS — K5900 Constipation, unspecified: Secondary | ICD-10-CM

## 2013-04-12 DIAGNOSIS — R1013 Epigastric pain: Secondary | ICD-10-CM

## 2013-04-12 LAB — HEPATIC FUNCTION PANEL
ALT: 16 U/L (ref 0–35)
AST: 19 U/L (ref 0–37)
Bilirubin, Direct: <0.1 mg/dL (ref 0.0–0.3)

## 2013-04-12 LAB — CBC WITH DIFFERENTIAL/PLATELET
Basophils Absolute: 0.1 10*3/uL (ref 0.0–0.1)
Basophils Relative: 1 % (ref 0–1)
Eosinophils Absolute: 0.1 10*3/uL (ref 0.0–0.7)
Eosinophils Relative: 1 % (ref 0–5)
MCH: 29.8 pg (ref 26.0–34.0)
MCV: 87.6 fL (ref 78.0–100.0)
Platelets: 409 10*3/uL — ABNORMAL HIGH (ref 150–400)
RDW: 13.8 % (ref 11.5–15.5)
WBC: 8.5 10*3/uL (ref 4.0–10.5)

## 2013-04-12 NOTE — Patient Instructions (Addendum)
Constipation information  Begin Linzess 145 - once daily  LFTs, CBC, UA, TSH  RUQ ultrasound for RUQ pain  Keep a stool diary  Telephone follow-up

## 2013-04-12 NOTE — Progress Notes (Signed)
Primary Care Physician:  Toma Deiters, MD Primary Gastroenterologist:  Dr. Jena Gauss  Pre-Procedure History & Physical: HPI:  Suzanne Morton is a 52 y.o. female here for followup of a chronic constipation. May go 2 weeks without a bowel movement.. Chronically bloated.[place] results with MiraLax in the past. Had a negative ileocolonoscopy earlier this year. No rectal bleeding. No reflux symptoms can't belch since she had a fundoplication previously. 5 day history of right upper quadrant abdominal pain without apparent inciting cause. Again,  no nausea or vomiting; no fever or chills -  not affected by eating;  may have a positional component. Does not remember straining. No flank pain,  no urinary tract symptoms. Gallbladder is out (biliary dyskinesia). Stop blood pressure meds on her own  recently.  Past Medical History  Diagnosis Date  . Hypertension   . Epistaxis   . HSV-2 (herpes simplex virus 2) infection   . Abnormal Pap smear     Past Surgical History  Procedure Laterality Date  . Tubal ligation    .  funduplication    . Cholecystectomy      Dr. Leona Carry  . Colonoscopy N/A 10/23/2012    Dr. Jena Gauss- normal rectum (anal papilla), colon and terminal ileum    Prior to Admission medications   Medication Sig Start Date End Date Taking? Authorizing Provider  ibuprofen (ADVIL,MOTRIN) 200 MG tablet Take 200 mg by mouth every 6 (six) hours as needed for pain.   Yes Historical Provider, MD  SALINE NASAL MIST NA Place 1-2 sprays into the nose daily as needed. As needed for nose bleeds   Yes Historical Provider, MD  metoprolol succinate (TOPROL-XL) 50 MG 24 hr tablet Take 1 tablet (50 mg total) by mouth daily. Take with or immediately following a meal. 05/22/12   Donnetta Hutching, MD  polyethylene glycol powder (GLYCOLAX/MIRALAX) powder Take 17 g by mouth daily. 10/10/12   Tiffany Kocher, PA-C  Probiotic Product (RESTORA) CAPS Take 1 capsule by mouth daily.    Historical Provider, MD    Allergies as of  04/12/2013 - Review Complete 04/12/2013  Allergen Reaction Noted  . Ivp dye [iodinated diagnostic agents] Hives, Shortness Of Breath, and Swelling 05/22/2012    Family History  Problem Relation Age of Onset  . Colon cancer Neg Hx   . Lung cancer Mother   . Alcoholism Father     History   Social History  . Marital Status: Divorced    Spouse Name: N/A    Number of Children: 2  . Years of Education: N/A   Occupational History  . Medical illustrator    Social History Main Topics  . Smoking status: Current Every Day Smoker  . Smokeless tobacco: Not on file  . Alcohol Use: No  . Drug Use: No  . Sexual Activity: Yes   Other Topics Concern  . Not on file   Social History Narrative  . No narrative on file    Review of Systems: See HPI, otherwise negative ROS  Physical Exam: BP 148/81  Pulse 74  Temp(Src) 98.4 F (36.9 C) (Oral)  Ht 5\' 4"  (1.626 m)  Wt 122 lb 3.2 oz (55.43 kg)  BMI 20.97 kg/m2 General:   Alert,  Well-developed, well-nourished, pleasant and cooperative in NAD Skin:  Intact without significant lesions or rashes. Eyes:  Sclera clear, no icterus.   Conjunctiva pink. Ears:  Normal auditory acuity. Nose:  No deformity, discharge,  or lesions. Mouth:  No deformity or lesions. Neck:  Supple; no masses or thyromegaly. No significant cervical adenopathy. Lungs:  Clear throughout to auscultation.   No wheezes, crackles, or rhonchi. No acute distress. Heart:  Regular rate and rhythm; no murmurs, clicks, rubs,  or gallops. Abdomen: Non-distended, normal bowel sounds.  Soft, she does have right upper quadrant tenderness. No appreciable mass or hepatosplenomegaly. No CVA tenderness.  Pulses:  Normal pulses noted. Extremities:  Without clubbing or edema.  Impression:  52 year old lady with chronic lifelong constipation. She does not always get an urge to have a bowel movement. Suspect she has more of colonic inertia than a functional outlet obstruction.  Blood  pressure a bit today. Came off of her medications on her own. Not really doing that well with MiraLax these days. Right upper quadrant abdominal pain and new problem. Maybe a musculoskeletal strain. Doubt more visceral process.   Recommendations:  Constipation information  Begin Linzess 145 - once daily  LFTs, CBC, UA, TSH  RUQ ultrasound for RUQ pain  Keep a stool diary  Telephone follow-up

## 2013-04-13 LAB — URINALYSIS W MICROSCOPIC + REFLEX CULTURE
Bacteria, UA: NONE SEEN
Bilirubin Urine: NEGATIVE
Crystals: NONE SEEN
Ketones, ur: NEGATIVE mg/dL
Protein, ur: NEGATIVE mg/dL
Urobilinogen, UA: 0.2 mg/dL (ref 0.0–1.0)

## 2013-04-16 ENCOUNTER — Ambulatory Visit (HOSPITAL_COMMUNITY)
Admission: RE | Admit: 2013-04-16 | Discharge: 2013-04-16 | Disposition: A | Discharge status: Home / Self Care | Payer: Self-pay | Source: Ambulatory Visit | Attending: Internal Medicine | Admitting: Internal Medicine

## 2013-04-16 DIAGNOSIS — K838 Other specified diseases of biliary tract: Secondary | ICD-10-CM | POA: Insufficient documentation

## 2013-04-16 DIAGNOSIS — R1013 Epigastric pain: Secondary | ICD-10-CM | POA: Insufficient documentation

## 2013-04-16 DIAGNOSIS — R109 Unspecified abdominal pain: Secondary | ICD-10-CM | POA: Insufficient documentation

## 2013-04-25 ENCOUNTER — Telehealth: Payer: Self-pay

## 2013-04-25 MED ORDER — LINACLOTIDE 145 MCG PO CAPS: 145.0000 ug | ORAL_CAPSULE | Freq: Every day | ORAL | Status: DC

## 2013-04-25 NOTE — Telephone Encounter (Signed)
Pt called- linzess 145 mcg working well. Pt requesting rx sent to pharmacy. #3 boxes of lizess samples given to pt.

## 2013-04-25 NOTE — Telephone Encounter (Signed)
Done

## 2013-05-24 ENCOUNTER — Other Ambulatory Visit: Payer: Self-pay

## 2013-05-28 ENCOUNTER — Telehealth: Payer: Self-pay

## 2013-05-28 NOTE — Telephone Encounter (Addendum)
The Linzess 145 mg

## 2013-05-28 NOTE — Telephone Encounter (Signed)
What medication

## 2013-05-28 NOTE — Telephone Encounter (Signed)
Pt feel like her medication is not working anymore. She would like to know what she can do. Please advise

## 2013-07-18 NOTE — Telephone Encounter (Signed)
Suzanne Morton   Can you look at this to see what she can take because the Linzess 145 is not working anymore. Please advise

## 2013-07-18 NOTE — Telephone Encounter (Signed)
Pt is aware and will call us in one week for a PR

## 2013-07-18 NOTE — Telephone Encounter (Signed)
Let's provide her samples of Linzess 290 mcg daily.  PR in 1 week.  If she does well, will send in prescription for Linzess 290 mcg daily.

## 2013-08-13 ENCOUNTER — Telehealth: Payer: Self-pay

## 2013-08-13 NOTE — Telephone Encounter (Signed)
Pt is calling to inform us that the Linzess 290 mcg is not work and she has not had a bowel movement in a couple of days. What does she need to do? Please advise

## 2013-08-13 NOTE — Telephone Encounter (Signed)
May go ahead and take a dose of Miralax now. Repeat again tomorrow if necessary. Did she stop taking Linzess and then restart?

## 2013-08-14 NOTE — Telephone Encounter (Signed)
Pt is aware of what  AS said and she will try that first and let us know.

## 2013-08-14 NOTE — Telephone Encounter (Signed)
Noted  

## 2013-08-14 NOTE — Telephone Encounter (Signed)
She may take Miralax twice a day in addition to Linzess.  May use dulcolax suppository or enema if necessary. Does she have any severe abdominal pain, distension, nausea or vomiting Let's offer an OV, non-urgent.

## 2013-08-14 NOTE — Telephone Encounter (Signed)
No she is still taking the Linzess and she is also taking the miralax with no results.

## 2013-10-17 ENCOUNTER — Telehealth: Payer: Self-pay | Admitting: *Deleted

## 2013-10-17 NOTE — Telephone Encounter (Signed)
Pt called stating she has been very sick since 3:00 am last night, pt thinks she may have a stomach bug, she has been dry heaving and pt said she has been doubled over with abd. Pain and bloating, pt still does not use the bathroom like she should, pt said she feels like she is going to have diarrhea, pt says sometimes she feels like she is choking from her ribs up to her throat, pt says it comes on occasionally. Last BM pt had was 3 or 4 days ago. Pt also said she is always gassy sometimes her gas pains are so bad she feels like she is having a heart attack. Please Advise [phone]

## 2013-10-17 NOTE — Telephone Encounter (Signed)
Tried to call with no answer  

## 2013-10-18 NOTE — Telephone Encounter (Signed)
Pt is aware, she is already taking a probiotic daily. Gave pt assistance forms and samples

## 2013-10-18 NOTE — Telephone Encounter (Signed)
Pt returned call this morning, pt stated she had samples of linzess the last time she was here, pt stated she has ran out of her samples, pt still does not have insurance. Pt has was taking miralax but she said if she takes it continuously it does not help as good as it does if she quits taking it for a while then starts taking it again. Pt said if she can get something to help with the gas she thinks that will help, pt said when she gets up in the morning she feels okay but by the end of the day her stomach is swollen, pt said she will look 3 months pregnant. Pt said she does not use the bathroom everyday she may use it once a week. Please Advise [phone].

## 2013-10-18 NOTE — Telephone Encounter (Signed)
We need to attempt to get Patient assistance for her with Linzess. Let's provide Linzess 290 mcg samples in interim. May take Miralax BID if needed. Needs to be on a bowel regimen routinely, not skipping days. Start taking a probiotic daily.

## 2013-11-27 ENCOUNTER — Telehealth: Payer: Self-pay | Admitting: *Deleted

## 2013-11-27 NOTE — Telephone Encounter (Signed)
#  4 bottles of Linzess and coupons for phazyme given.

## 2013-11-27 NOTE — Telephone Encounter (Signed)
Pt called wanting to get samples of linzess and phazyme. Please advise

## 2013-11-28 IMAGING — CT CT HEAD W/O CM
1 series · 16 of 30 positions shown, 20 images · non-contrast
Comparison: Brain MRI 06/02/2004.

CLINICAL DATA: 51-year-old female with right upper extremity
numbness and hypertension.

CT HEAD WITHOUT CONTRAST
TECHNIQUE: Contiguous axial images were obtained from the base of
the skull through the vertex without contrast.

[Series 2: headseq 4.8 h37s · axial · 0.43mm/px · z∈[+416,+576]mm · 16 of 36 slices shown, 20 images]
[im 2/36  brain]
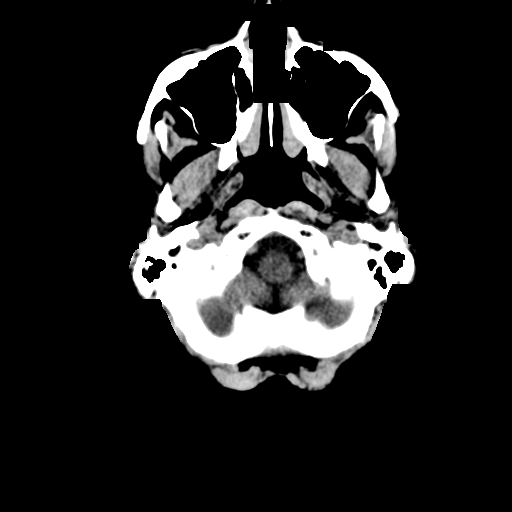
[im 2/36  bone]
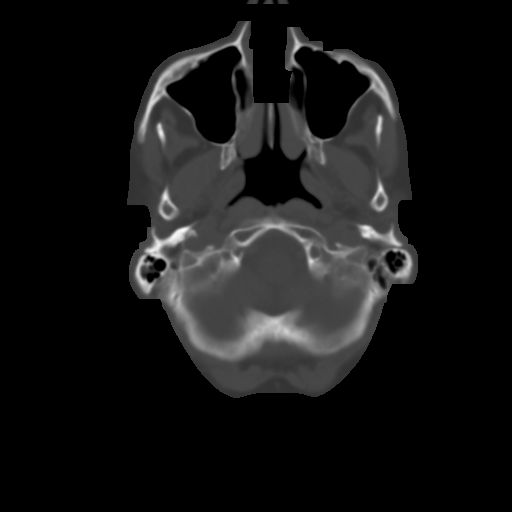
[im 4/36  brain]
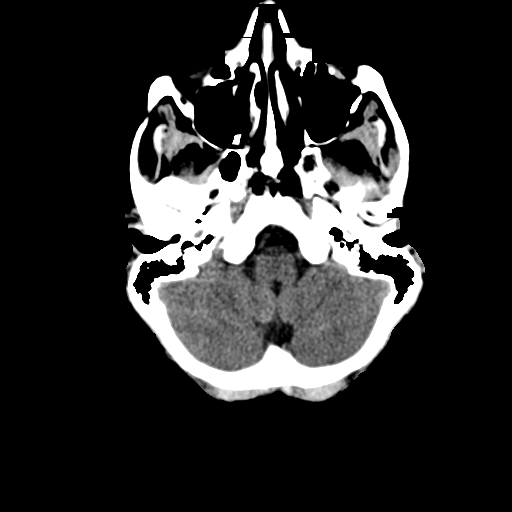
[im 7/36  brain]
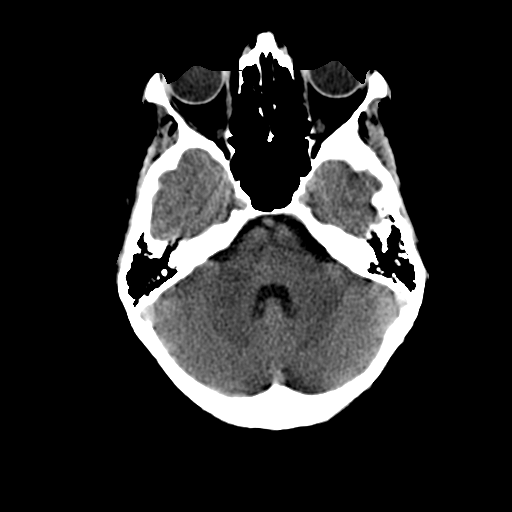
[im 9/36  brain]
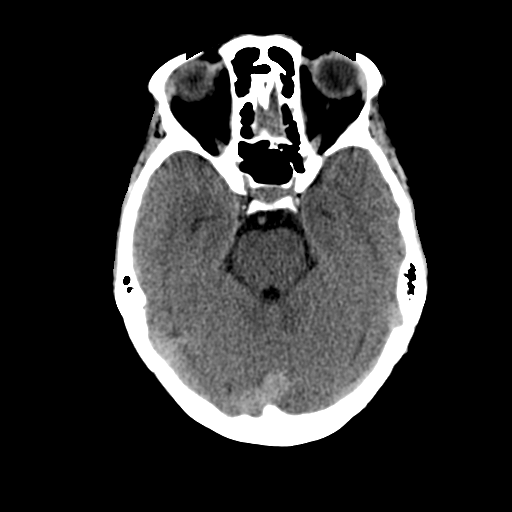
[im 10/36  brain]
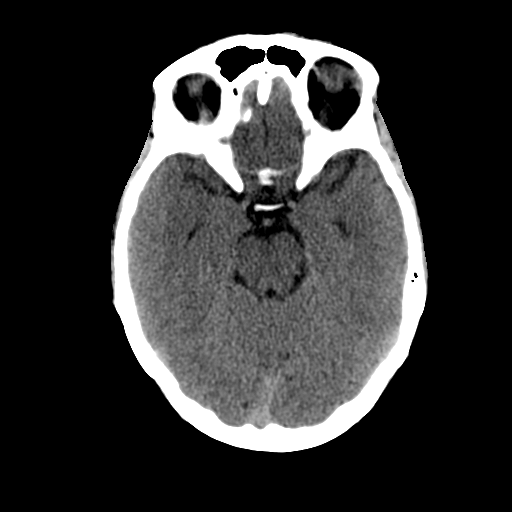
[im 10/36  bone]
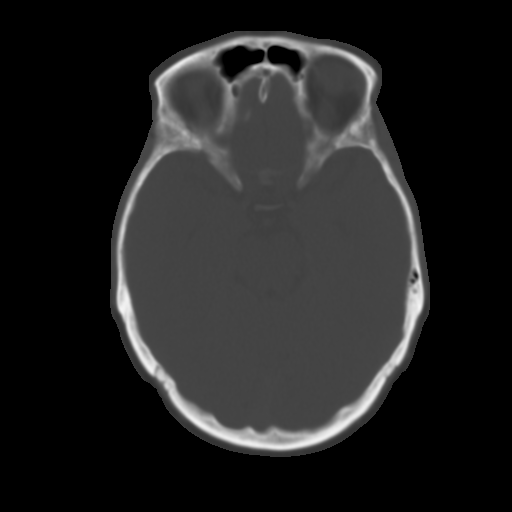
[im 13/36  brain]
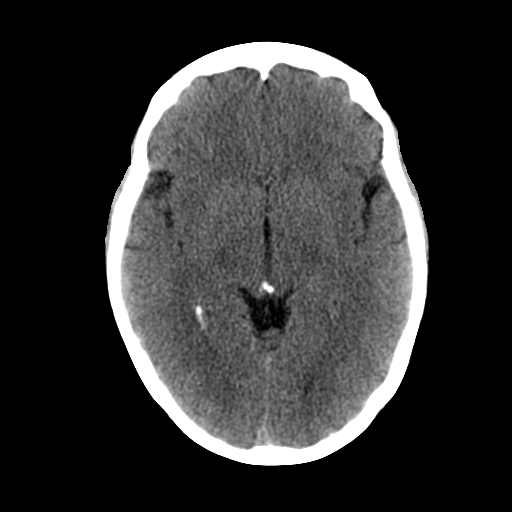
[im 15/36  brain]
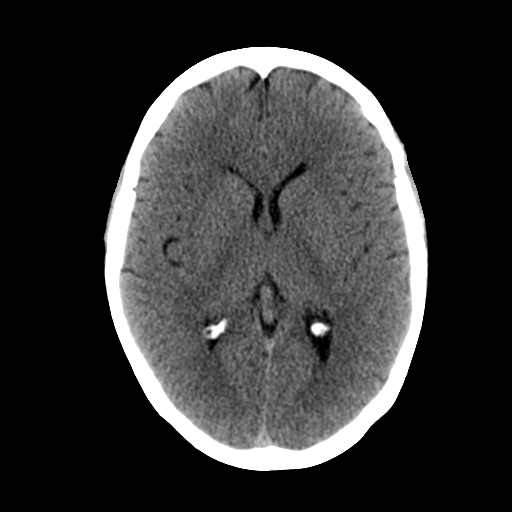
[im 17/36  brain]
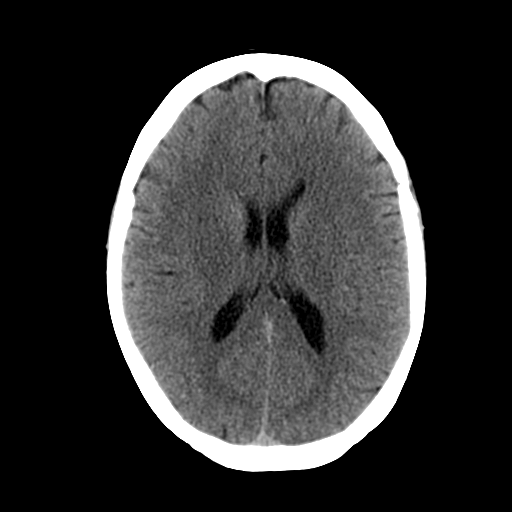
[im 19/36  brain]
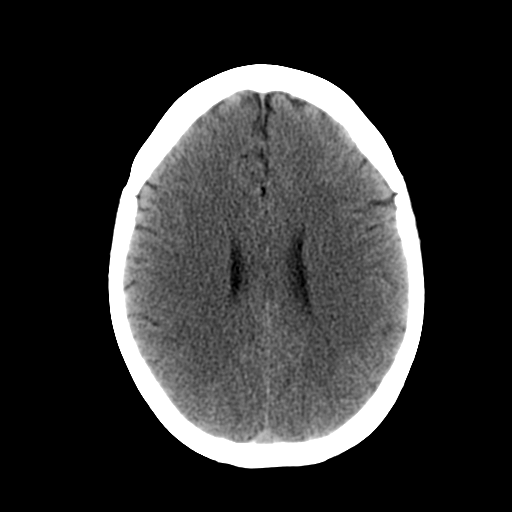
[im 19/36  bone]
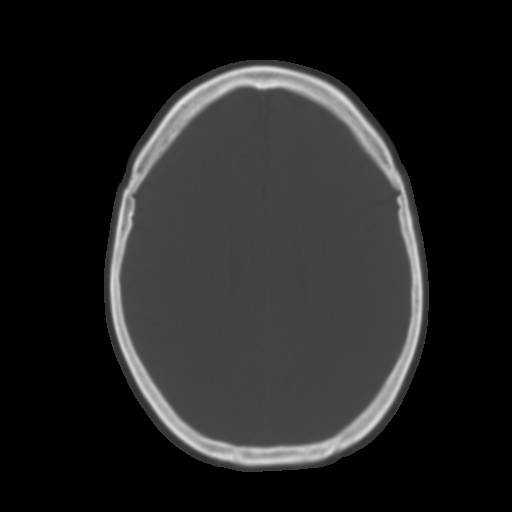
[im 21/36  brain]
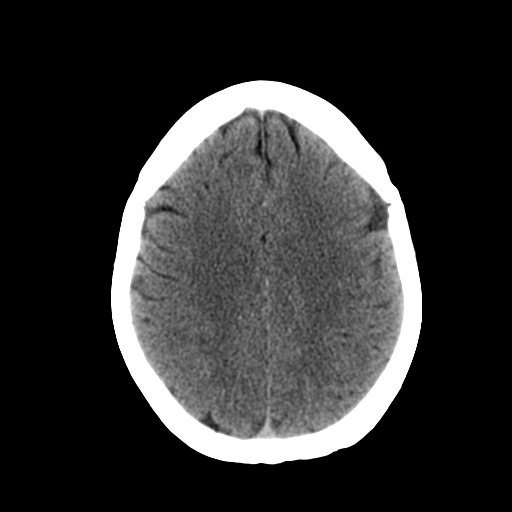
[im 23/36  brain]
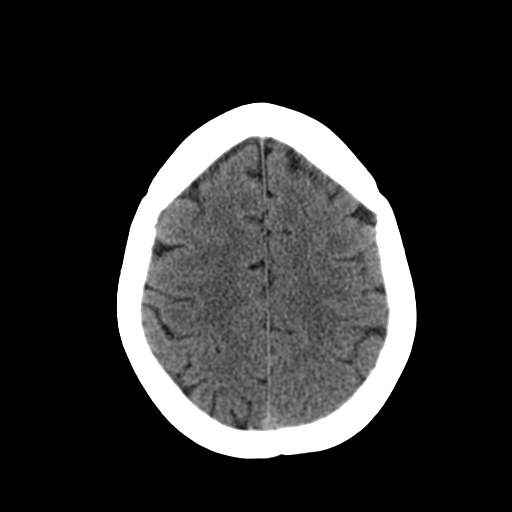
[im 26/36  brain]
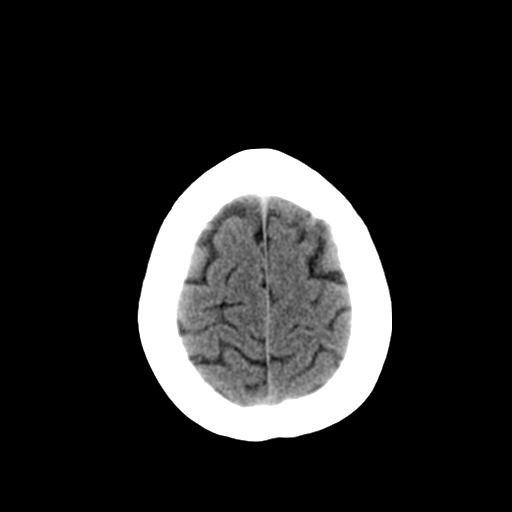
[im 27/36  brain]
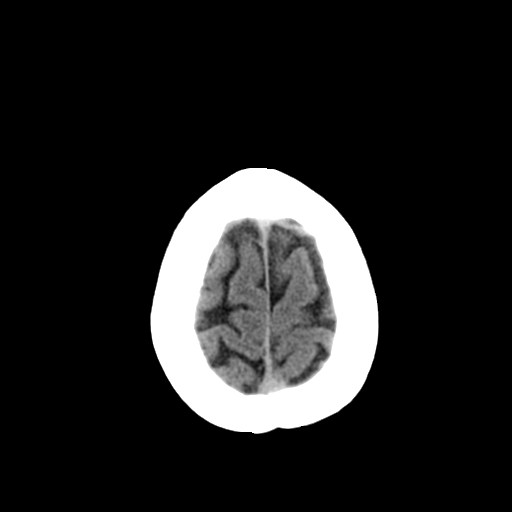
[im 27/36  bone]
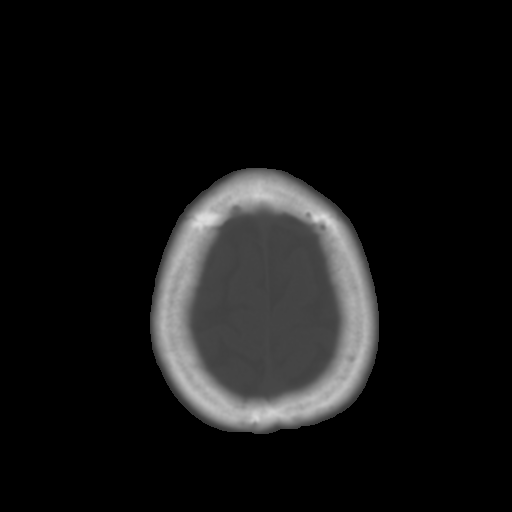
[im 29/36  brain]
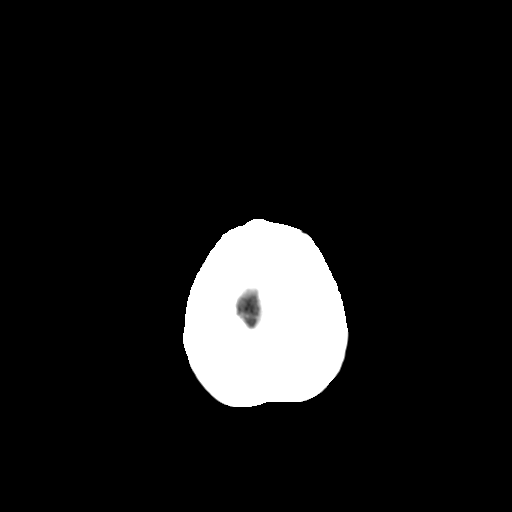
[im 32/36  brain]
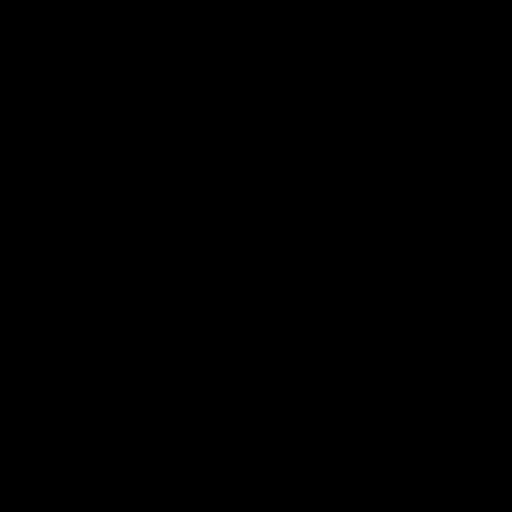
[im 34/36  brain]
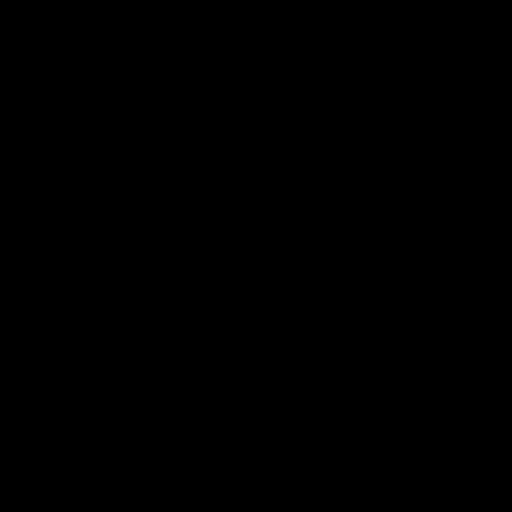

[16 of 30 positions shown; findings below may reference images not displayed]

FINDINGS: Visualized paranasal sinuses and mastoids are clear.  No
acute osseous abnormality identified.  Visualized orbit soft
tissues are within normal limits.  Visualized scalp soft tissues
are within normal limits.

Cerebral volume is within normal limits for age.  No midline shift,
ventriculomegaly, mass effect, evidence of mass lesion,
intracranial hemorrhage or evidence of cortically based acute
infarction.  Gray-white matter differentiation is within normal
limits throughout the brain.  No suspicious intracranial vascular
hyperdensity.
IMPRESSION: Normal noncontrast CT appearance of the brain.

## 2014-01-01 ENCOUNTER — Telehealth: Payer: Self-pay | Admitting: *Deleted

## 2014-01-01 NOTE — Telephone Encounter (Signed)
Pt called wanting to see if she can get samples of nexium, per pt she has a gassy feeling in her chest. Please advise! [phone] or [phone]

## 2014-01-01 NOTE — Telephone Encounter (Signed)
Routing to RMR for review. We have not put this pt on nexium and I cant give samples without provider ok.

## 2014-01-02 NOTE — Telephone Encounter (Signed)
Routing to [place] to inform pt.

## 2014-01-02 NOTE — Telephone Encounter (Signed)
No-if we did not prescribe

## 2014-01-03 NOTE — Telephone Encounter (Signed)
I spoke with the pt, she is aware, pt was not sure if she could but she just wanted to ask.

## 2014-05-03 ENCOUNTER — Other Ambulatory Visit: Payer: Self-pay

## 2014-05-20 ENCOUNTER — Encounter: Payer: Self-pay | Admitting: Internal Medicine

## 2014-11-11 ENCOUNTER — Other Ambulatory Visit: Payer: Self-pay | Admitting: Obstetrics & Gynecology

## 2014-11-11 DIAGNOSIS — R52 Pain, unspecified: Secondary | ICD-10-CM

## 2014-11-18 ENCOUNTER — Telehealth: Payer: Self-pay | Admitting: Obstetrics & Gynecology

## 2014-11-18 NOTE — Telephone Encounter (Signed)
Pt aware that results are not in yet. Pt had mammogram done at [facility] and they were supposed to fax us the results. The pt stated that she would call them and see if they could fax the results again.

## 2015-01-02 ENCOUNTER — Encounter: Payer: Self-pay | Admitting: Obstetrics & Gynecology

## 2015-04-15 ENCOUNTER — Ambulatory Visit (INDEPENDENT_AMBULATORY_CARE_PROVIDER_SITE_OTHER): Payer: Self-pay | Admitting: Internal Medicine

## 2015-04-15 ENCOUNTER — Other Ambulatory Visit: Payer: Self-pay

## 2015-04-15 ENCOUNTER — Encounter: Payer: Self-pay | Admitting: Internal Medicine

## 2015-04-15 VITALS — BP 143/84 | HR 81 | Temp 97.2°F | Ht 64.0 in | Wt 126.6 lb

## 2015-04-15 DIAGNOSIS — IMO0002 Reserved for concepts with insufficient information to code with codable children: Secondary | ICD-10-CM

## 2015-04-15 DIAGNOSIS — E739 Lactose intolerance, unspecified: Secondary | ICD-10-CM

## 2015-04-15 DIAGNOSIS — R1013 Epigastric pain: Secondary | ICD-10-CM

## 2015-04-15 DIAGNOSIS — K589 Irritable bowel syndrome without diarrhea: Secondary | ICD-10-CM

## 2015-04-15 MED ORDER — HYOSCYAMINE SULFATE 0.125 MG SL SUBL: 0.1250 mg | SUBLINGUAL_TABLET | Freq: Three times a day (TID) | SUBLINGUAL | Status: DC

## 2015-04-15 NOTE — Progress Notes (Signed)
Primary Care Physician:  Isabella Stalling, MD Primary Gastroenterologist:  Dr. Jena Gauss  Pre-Procedure History & Physical: HPI:  Suzanne Morton is a 54 y.o. female here for for evaluation of alternating constipation anddiarrhea.  Chronic symptoms for years. Constipated most of the time. Last seen here 2 years ago laboratory workup of ultrasound all negative (status post cholecystectomy and fundoplication for refractory GERD many years ago. No dysphagia. Has epigastric burning for which he takes Maalox daily.  Was doing well on Linzess 290 daily but could not afford it: Constipatedmost of the time has abdominal cramps and diarrhea when she travels and goes out to eat and other stressful situations. Has abdominal bloating and gurgling with the area products. She's gained 4 pounds since her last visit here no significant intercurrent medical problems otherwise.  Negative ileocolonoscopy 2 years ago.  Past Medical History  Diagnosis Date  . Hypertension   . Epistaxis   . HSV-2 (herpes simplex virus 2) infection   . Abnormal Pap smear     Past Surgical History  Procedure Laterality Date  . Tubal ligation    .  funduplication    . Cholecystectomy      Dr. Leona Carry  . Colonoscopy N/A 10/23/2012    Dr. Jena Gauss- normal rectum (anal papilla), colon and terminal ileum    Prior to Admission medications   Medication Sig Start Date End Date Taking? Authorizing Provider  ibuprofen (ADVIL,MOTRIN) 200 MG tablet Take 200 mg by mouth every 6 (six) hours as needed for pain.   Yes Historical Provider, MD  metoprolol succinate (TOPROL-XL) 50 MG 24 hr tablet Take 1 tablet (50 mg total) by mouth daily. Take with or immediately following a meal. 05/22/12  Yes Donnetta Hutching, MD  polyethylene glycol powder (GLYCOLAX/MIRALAX) powder Take 17 g by mouth daily. 10/10/12  Yes Tiffany Kocher, PA-C  Linaclotide (LINZESS) 145 MCG CAPS capsule Take 1 capsule (145 mcg total) by mouth daily. 30 minutes before  breakfast Patient not taking: Reported on 04/15/2015 04/25/13   Nira Retort, NP  Probiotic Product [facility]) CAPS Take 1 capsule by mouth daily.    Historical Provider, MD  SALINE NASAL MIST NA Place 1-2 sprays into the nose daily as needed. As needed for nose bleeds    Historical Provider, MD    Allergies as of 04/15/2015 - Review Complete 04/15/2015  Allergen Reaction Noted  . Ivp dye [iodinated diagnostic agents] Hives, Shortness Of Breath, and Swelling 05/22/2012    Family History  Problem Relation Age of Onset  . Colon cancer Neg Hx   . Lung cancer Mother   . Alcoholism Father     Social History   Social History  . Marital Status: Divorced    Spouse Name: N/A  . Number of Children: 2  . Years of Education: N/A   Occupational History  . Medical illustrator    Social History Main Topics  . Smoking status: Current Every Day Smoker  . Smokeless tobacco: Not on file  . Alcohol Use: No  . Drug Use: No  . Sexual Activity: Yes   Other Topics Concern  . Not on file   Social History Narrative    Review of Systems: See HPI, otherwise negative ROS  Physical Exam: BP 143/84 mmHg  Pulse 81  Temp(Src) 97.2 F (36.2 C)  Ht  (1.626 m)  Wt 126 lb 9.6 oz (57.425 kg)  BMI 21.72 kg/m2 General:   Alert,  Well-developed, well-nourished, pleasant and cooperative in  NAD Skin:  Intact without significant lesions or rashes. Eyes:  Sclera clear, no icterus.   Conjunctiva pink. Neck:  Supple; no masses or thyromegaly. No significant cervical adenopathy. Lungs:  Clear throughout to auscultation.   No wheezes, crackles, or rhonchi. No acute distress. Heart:  Regular rate and rhythm; no murmurs, clicks, rubs,  or gallops. Abdomen: Non-distended, normal bowel sounds.  Soft and nontender without appreciable mass or hepatosplenomegaly.  Pulses:  Normal pulses noted. Extremities:  Without clubbing or edema.  Impression:  Pleasant 54 year old lady with alternating constipation/  diarrhea most consistent with irritable bowel syndrome. She has constipation predominant IBS these days. She also likely has lactase deficiency. Dyspepsia seems to be a separate symptom requiring antacids frequently. Really no dysphagia or typical GERD symptoms. Occasional NSAID use. I discussed the benign but chronic nature of irritable bowel syndrome. Our goal should be to control her symptoms to the point where it does not affect her quality of life. There is no cure. She needs further evaluation of her upper GI tract symptoms   Recommendations:  Begin Linzess 290 daily. Discussed possibility of diarrhea. Samples provided.  Levsin 0.125 mg under the tongue before meals and at bedtime for abdominal cramps / diarrhea (disp  #30) with 11 refills).  Keep a stool diarrhea  Schedule diagnostic EGD - dyspepsia. The risks, benefits, limitations, alternatives and imponderables have been reviewed with the patient. Potential for esophageal dilation, biopsy, etc. have also been reviewed.  Questions have been answered. All parties agreeable.  Information on IBS provided.  Lactose-free diet  Further recommendations to follow   Notice: This dictation was prepared with Dragon dictation along with smaller phrase technology. Any transcriptional errors that result from this process are unintentional and may not be corrected upon review.

## 2015-04-15 NOTE — Patient Instructions (Signed)
Begin Linzess 290 daily  Levsin 0.125 mg under the tongue before meals and at bedtime for abdominal cramps / diarrhea (disp  #30) with 11 refills  Keep a stool diarrhea  Schedule diagnostic EGD - dyspepsia  Information on IBS provided  Further recommendations to follow

## 2015-04-17 ENCOUNTER — Telehealth: Payer: Self-pay

## 2015-04-17 NOTE — Telephone Encounter (Signed)
prn

## 2015-04-17 NOTE — Telephone Encounter (Signed)
Marchelle Folks from [facility] called about Levsin. They are wanting to know if the rx should be prn or should the pt be taking it qac & hs everyday? The current rx for #30 will only last the pt 8 days if she takes it everyday.  Do you want [place] to change it to prn or do you want to change the quantity to last for a whole month?

## 2015-04-17 NOTE — Telephone Encounter (Signed)
Suzanne Morton at [facility] is aware.

## 2015-04-17 NOTE — Telephone Encounter (Signed)
Routing to Julie.  

## 2015-05-02 ENCOUNTER — Emergency Department (HOSPITAL_COMMUNITY): Payer: Self-pay

## 2015-05-02 ENCOUNTER — Encounter (HOSPITAL_COMMUNITY): Payer: Self-pay | Admitting: *Deleted

## 2015-05-02 ENCOUNTER — Emergency Department (HOSPITAL_COMMUNITY)
Admission: EM | Admit: 2015-05-02 | Discharge: 2015-05-02 | Disposition: A | Discharge status: Home / Self Care | Payer: Self-pay | Attending: Emergency Medicine | Admitting: Emergency Medicine

## 2015-05-02 DIAGNOSIS — Z79899 Other long term (current) drug therapy: Secondary | ICD-10-CM | POA: Insufficient documentation

## 2015-05-02 DIAGNOSIS — I10 Essential (primary) hypertension: Secondary | ICD-10-CM | POA: Insufficient documentation

## 2015-05-02 DIAGNOSIS — M79671 Pain in right foot: Secondary | ICD-10-CM | POA: Insufficient documentation

## 2015-05-02 DIAGNOSIS — Z720 Tobacco use: Secondary | ICD-10-CM | POA: Insufficient documentation

## 2015-05-02 DIAGNOSIS — Z8619 Personal history of other infectious and parasitic diseases: Secondary | ICD-10-CM | POA: Insufficient documentation

## 2015-05-02 MED ORDER — HYDROCODONE-ACETAMINOPHEN 5-325 MG PO TABS
1.0000 | ORAL_TABLET | Freq: Once | ORAL | Status: AC
  Administered 2015-05-02: 1 via ORAL
  Filled 2015-05-02: qty 1

## 2015-05-02 MED ORDER — HYDROCODONE-ACETAMINOPHEN 5-325 MG PO TABS: ORAL_TABLET | ORAL | Status: DC

## 2015-05-02 NOTE — ED Notes (Signed)
Pt c/o right foot pain x 3 weeks. Pt states the pain is extreme.

## 2015-05-02 NOTE — ED Provider Notes (Signed)
CSN: [id number]     Arrival date & time 05/02/15  2032 History   First MD Initiated Contact with Patient 05/02/15 2037     Chief Complaint  Patient presents with  . Foot Pain     (Consider location/radiation/quality/duration/timing/severity/associated sxs/prior Treatment) HPI   Suzanne Morton is a 54 y.o. female who presents to the Emergency Department complaining of gradual onset of right foot pain.  She states the pain has been present for 3 weeks.  She describes a sharp, aching pain to the top of her right foot at the base of her toes.  Pain is worse with palpation and with weight bearing and flexion of her foot.  She also states that occasionally a sharp pain will radiate up her calf and swelling to the top of her foot.  She states she was seen recently at another hospital and diagnosed with "tendonitis" of her foot and she is currently taking prednisone and Voltaren tablets without relief.  She comes to the ED tonight for pain control.  She states she has an upcoming appt with a podiatrist.  She denies known injury, redness, decreased sensation, numbness, open wounds.  No reported hx of DM   Past Medical History  Diagnosis Date  . Hypertension   . Epistaxis   . HSV-2 (herpes simplex virus 2) infection   . Abnormal Pap smear    Past Surgical History  Procedure Laterality Date  . Tubal ligation    .  funduplication    . Cholecystectomy      Dr. Leona Carryestefano  . Colonoscopy N/A 10/23/2012    Dr. Jena Gaussourk- normal rectum (anal papilla), colon and terminal ileum   Family History  Problem Relation Age of Onset  . Colon cancer Neg Hx   . Lung cancer Mother   . Alcoholism Father    Social History  Substance Use Topics  . Smoking status: Current Every Day Smoker  . Smokeless tobacco: None  . Alcohol Use: No   OB History    Gravida Para Term Preterm AB TAB SAB Ectopic Multiple Living   2 2        2      Review of Systems  Constitutional: Negative for fever and chills.   Gastrointestinal: Negative for nausea and vomiting.  Musculoskeletal: Positive for arthralgias.       Right foot pain and swelling  Skin: Negative for color change and wound.  Neurological: Negative for weakness and numbness.  All other systems reviewed and are negative.     Allergies  Ivp dye  Home Medications   Prior to Admission medications   Medication Sig Start Date End Date Taking? Authorizing Provider  hyoscyamine (LEVSIN SL) 0.125 MG SL tablet Place 1 tablet (0.125 mg total) under the tongue 4 (four) times daily -  before meals and at bedtime. 04/15/15   Corbin Adeobert M Rourk, MD  ibuprofen (ADVIL,MOTRIN) 200 MG tablet Take 200 mg by mouth every 6 (six) hours as needed for pain.    Historical Provider, MD  metoprolol succinate (TOPROL-XL) 50 MG 24 hr tablet Take 1 tablet (50 mg total) by mouth daily. Take with or immediately following a meal. 05/22/12   Donnetta HutchingBrian Cook, MD  polyethylene glycol powder (GLYCOLAX/MIRALAX) powder Take 17 g by mouth daily. 10/10/12   Tiffany KocherLeslie S Lewis, PA-C   BP 175/97 mmHg  Pulse 89  Temp(Src) 97.6 F (36.4 C) (Oral)  Resp 16  Wt 111 lb (50.349 kg)  SpO2 100% Physical Exam  Constitutional: She is  oriented to person, place, and time. She appears well-developed and well-nourished. No distress.  HENT:  Head: Normocephalic.  Cardiovascular: Normal rate, regular rhythm and intact distal pulses.   No murmur heard. Pulmonary/Chest: Effort normal and breath sounds normal. No respiratory distress.  Musculoskeletal: She exhibits edema and tenderness.  Localized ttp of the dorsal right foot at the base of the second and third toes.  Mild edema present.  No bony deformity.  No open wounds, pain to the toes, erythema or pain to the ankle or hind foot. DP pulse is brisk, distal sensation intact.  Compartments soft. No tenderness proximal to the foot  Neurological: She is alert and oriented to person, place, and time. She exhibits normal muscle tone. Coordination normal.   Skin: Skin is warm. No erythema.  Nursing note and vitals reviewed.   ED Course  Procedures (including critical care time)  Imaging Review Dg Foot Complete Right  05/02/2015  CLINICAL DATA:  Right foot pain and swelling for 3 weeks, no known injury EXAM: RIGHT FOOT COMPLETE - 3+ VIEW COMPARISON:  04/26/2015 FINDINGS: Three views of the right foot submitted. No acute fracture or subluxation. Tiny plantar spur of calcaneus. IMPRESSION: No acute fracture or subluxation.  Tiny plantar spur of calcaneus. Electronically Signed   By: Natasha Mead M.D.   On: 05/02/2015 21:26   I have personally reviewed and evaluated these images and lab results as part of my medical decision-making.    MDM   Final diagnoses:  Foot pain, right    Localized ttp of the dorsal right foot.  No clinical sx's to suggest infectious process.  NV intact.  XR's neg for fx.  Pt has upcoming podiatry appt. next week.  Pt is currently taking prednisone, will have her continue, d/c the Voltaren tablets and I will prescribe a short course of hydrocodone.  Post-op shoe applied for comfort.  Pt stable for d/c and agrees to care plan.      Pauline Aus, PA-C 05/02/15 2153  Cathren Laine, MD 05/02/15 2239

## 2015-05-02 NOTE — ED Notes (Signed)
EDPa in to see pt for initial assessment.

## 2015-05-05 ENCOUNTER — Encounter (HOSPITAL_COMMUNITY): Payer: Self-pay | Admitting: *Deleted

## 2015-05-05 ENCOUNTER — Ambulatory Visit (HOSPITAL_COMMUNITY)
Admission: RE | Admit: 2015-05-05 | Discharge: 2015-05-05 | Disposition: A | Discharge status: Home / Self Care | Payer: Self-pay | Source: Ambulatory Visit | Attending: Internal Medicine | Admitting: Internal Medicine

## 2015-05-05 ENCOUNTER — Encounter (HOSPITAL_COMMUNITY)
Admission: RE | Disposition: A | Discharge status: Home / Self Care | Payer: Self-pay | Source: Ambulatory Visit | Attending: Internal Medicine

## 2015-05-05 ENCOUNTER — Other Ambulatory Visit: Payer: Self-pay | Admitting: Internal Medicine

## 2015-05-05 DIAGNOSIS — K2100 Gastro-esophageal reflux disease with esophagitis, without bleeding: Secondary | ICD-10-CM | POA: Insufficient documentation

## 2015-05-05 DIAGNOSIS — I10 Essential (primary) hypertension: Secondary | ICD-10-CM | POA: Insufficient documentation

## 2015-05-05 DIAGNOSIS — K259 Gastric ulcer, unspecified as acute or chronic, without hemorrhage or perforation: Secondary | ICD-10-CM | POA: Insufficient documentation

## 2015-05-05 DIAGNOSIS — Z79899 Other long term (current) drug therapy: Secondary | ICD-10-CM | POA: Insufficient documentation

## 2015-05-05 DIAGNOSIS — R1013 Epigastric pain: Secondary | ICD-10-CM | POA: Insufficient documentation

## 2015-05-05 DIAGNOSIS — F17200 Nicotine dependence, unspecified, uncomplicated: Secondary | ICD-10-CM | POA: Insufficient documentation

## 2015-05-05 DIAGNOSIS — Z801 Family history of malignant neoplasm of trachea, bronchus and lung: Secondary | ICD-10-CM | POA: Insufficient documentation

## 2015-05-05 DIAGNOSIS — K279 Peptic ulcer, site unspecified, unspecified as acute or chronic, without hemorrhage or perforation: Secondary | ICD-10-CM | POA: Insufficient documentation

## 2015-05-05 HISTORY — PX: ESOPHAGOGASTRODUODENOSCOPY: SHX5428

## 2015-05-05 SURGERY — EGD (ESOPHAGOGASTRODUODENOSCOPY)
Anesthesia: Moderate Sedation

## 2015-05-05 MED ORDER — MEPERIDINE HCL 100 MG/ML IJ SOLN
INTRAMUSCULAR | Status: DC | PRN
  Administered 2015-05-05 (×2): 50 mg via INTRAVENOUS

## 2015-05-05 MED ORDER — ONDANSETRON HCL 4 MG/2ML IJ SOLN
INTRAMUSCULAR | Status: DC | PRN
  Administered 2015-05-05: 4 mg via INTRAVENOUS

## 2015-05-05 MED ORDER — STERILE WATER FOR IRRIGATION IR SOLN
Status: DC | PRN
  Administered 2015-05-05: 15:00:00

## 2015-05-05 MED ORDER — MIDAZOLAM HCL 5 MG/5ML IJ SOLN
INTRAMUSCULAR | Status: AC
  Filled 2015-05-05: qty 10

## 2015-05-05 MED ORDER — ONDANSETRON HCL 4 MG/2ML IJ SOLN
INTRAMUSCULAR | Status: AC
  Filled 2015-05-05: qty 2

## 2015-05-05 MED ORDER — SODIUM CHLORIDE 0.9 % IV SOLN
INTRAVENOUS | Status: DC
  Administered 2015-05-05: 14:00:00 via INTRAVENOUS

## 2015-05-05 MED ORDER — LIDOCAINE VISCOUS 2 % MT SOLN
OROMUCOSAL | Status: AC
  Filled 2015-05-05: qty 15

## 2015-05-05 MED ORDER — MIDAZOLAM HCL 5 MG/5ML IJ SOLN
INTRAMUSCULAR | Status: DC | PRN
  Administered 2015-05-05 (×2): 2 mg via INTRAVENOUS

## 2015-05-05 MED ORDER — LIDOCAINE VISCOUS 2 % MT SOLN
OROMUCOSAL | Status: DC | PRN
  Administered 2015-05-05: 3 mL via OROMUCOSAL

## 2015-05-05 MED ORDER — MEPERIDINE HCL 100 MG/ML IJ SOLN
INTRAMUSCULAR | Status: AC
Start: 2015-05-05 — End: 2015-05-06
  Filled 2015-05-05: qty 2

## 2015-05-05 MED ORDER — PANTOPRAZOLE SODIUM 40 MG PO TBEC: 40.0000 mg | DELAYED_RELEASE_TABLET | Freq: Two times a day (BID) | ORAL | Status: DC

## 2015-05-05 NOTE — Interval H&P Note (Signed)
History and Physical Interval Note:  05/05/2015 2:41 PM  Suzanne Morton  has presented today for surgery, with the diagnosis of DYSPEPSIA  The various methods of treatment have been discussed with the patient and family. After consideration of risks, benefits and other options for treatment, the patient has consented to  Procedure(s) with comments: ESOPHAGOGASTRODUODENOSCOPY (EGD) (N/A) - 300 as a surgical intervention .  The patient's history has been reviewed, patient examined, no change in status, stable for surgery.  I have reviewed the patient's chart and labs.  Questions were answered to the patient's satisfaction.     Linzess is working well for constipation. Otherwise, no change. Diagnostic EGD per plan. Patient denies dysphagia.The risks, benefits, limitations, alternatives and imponderables have been reviewed with the patient. Potential for esophageal dilation, biopsy, etc. have also been reviewed.  Questions have been answered. All parties agreeable.  Suzanne Morton

## 2015-05-05 NOTE — Op Note (Signed)
[facility] [address] [place], [id number]   ENDOSCOPY PROCEDURE REPORT  PATIENT: Suzanne Morton, Ahtziri M  MR#: [id number] BIRTHDATE: 05/04/1961 , 54  yrs. old GENDER: female ENDOSCOPIST: R.  Roetta SessionsMichael Jemaine Prokop, MD FACP FACG REFERRED BY:  Oval Linseyichard Dondiego, M.D. PROCEDURE DATE:  05/05/2015 PROCEDURE:  EGD w/ biopsy INDICATIONS:  Dyspepsia. MEDICATIONS: Versed 4 mg IV and Demerol 100 mg IV in divided doses. Xylocaine gel orally.  Zofran 4 mg IV. ASA CLASS:      Class II  CONSENT: The risks, benefits, limitations, alternatives and imponderables have been discussed.  The potential for biopsy, esophogeal dilation, etc. have also been reviewed.  Questions have been answered.  All parties agreeable.  Please see the history and physical in the medical record for more information.  DESCRIPTION OF PROCEDURE: After the risks benefits and alternatives of the procedure were thoroughly explained, informed consent was obtained.  The EG-[id number] ([id number] endoscope was introduced through the mouth and advanced to the second portion of the duodenum , limited by Without limitations. The instrument was slowly withdrawn as the mucosa was fully examined. Estimated blood loss is zero unless otherwise noted in this procedure report.    Circumferential distal esophageal erosions with a 5 mm area of ulceration straddling the GE junction.  No infiltrating process seen.  No Barrett's epithelium.  Tubular esophagus patent throughout its course. Stomach empty.  Intact Nissen fundoplication present.  Patient multiple antral erosions and areas of ulceration with a largest area of ulceration make proximally 7 mm in dimension. Patent pylorus.  Normal-appearing first and second portion of the duodenum.  Biopsies of the abnormal antrum taken for histologic study. Retroflexed views revealed as previously described.     The scope was then withdrawn from the patient and the procedure  completed.  COMPLICATIONS: There were no immediate complications. EBL 3 mL ENDOSCOPIC IMPRESSION: Erosive/history of reflux esophagitis. Multiple antral ulcers/erosions. Prior fundoplication?"intact. Status post gastric biopsy.  RECOMMENDATIONS: Begin Protonix 40 mg twice daily. Avoid all nonsteroidals. Further recommendations to follow pending review of pathology report   REPEAT EXAM:  eSigned:  R. Roetta SessionsMichael Neira Bentsen, MD Jerrel IvoryFACP [facility] MedicineFACG 05/05/2015 3:04 PM    CC:  CPT CODES: ICD CODES:  The ICD and CPT codes recommended by this software are interpretations from the data that the clinical staff has captured with the software.  The verification of the translation of this report to the ICD and CPT codes and modifiers is the sole responsibility of the health care institution and practicing physician where this report was generated.  [employer] will not be held responsible for the validity of the ICD and CPT codes included on this report.  AMA assumes no liability for data contained or not contained herein. CPT is a Publishing rights managerregistered trademark of the [employer].  PATIENT NAME:  Suzanne Morton, [name] MR#: [id number]

## 2015-05-05 NOTE — Discharge Instructions (Signed)
EGD Discharge instructions Please read the instructions outlined below and refer to this sheet in the next few weeks. These discharge instructions provide you with general information on caring for yourself after you leave the hospital. Your doctor may also give you specific instructions. While your treatment has been planned according to the most current medical practices available, unavoidable complications occasionally occur. If you have any problems or questions after discharge, please call your doctor. ACTIVITY  You may resume your regular activity but move at a slower pace for the next 24 hours.   Take frequent rest periods for the next 24 hours.   Walking will help expel (get rid of) the air and reduce the bloated feeling in your abdomen.   No driving for 24 hours (because of the anesthesia (medicine) used during the test).   You may shower.   Do not sign any important legal documents or operate any machinery for 24 hours (because of the anesthesia used during the test).  NUTRITION  Drink plenty of fluids.   You may resume your normal diet.   Begin with a light meal and progress to your normal diet.   Avoid alcoholic beverages for 24 hours or as instructed by your caregiver.  MEDICATIONS  You may resume your normal medications unless your caregiver tells you otherwise.  WHAT YOU CAN EXPECT TODAY  You may experience abdominal discomfort such as a feeling of fullness or gas pains.  FOLLOW-UP  Your doctor will discuss the results of your test with you.  SEEK IMMEDIATE MEDICAL ATTENTION IF ANY OF THE FOLLOWING OCCUR:  Excessive nausea (feeling sick to your stomach) and/or vomiting.   Severe abdominal pain and distention (swelling).   Trouble swallowing.   Temperature over 101 F (37.8 C).   Rectal bleeding or vomiting of blood.   Information on reflux esophagitis and peptic ulcer disease provided  Avoid nonsteroidal agents like Advil and Aleve.  This includes  aspirin powders like goodies, etc.  Begin Protonix 40 mg twice daily  Further recommendations to follow pending review of pathology report  Office visit with [place] in 3 months   Peptic Ulcer A peptic ulcer is a sore in the lining of your esophagus (esophageal ulcer), stomach (gastric ulcer), or in the first part of your small intestine (duodenal ulcer). The ulcer causes erosion into the deeper tissue. CAUSES  Normally, the lining of the stomach and the small intestine protects itself from the acid that digests food. The protective lining can be damaged by:  An infection caused by a bacterium called Helicobacter pylori (H. pylori).  Regular use of nonsteroidal anti-inflammatory drugs (NSAIDs), such as ibuprofen or aspirin.  Smoking tobacco. Other risk factors include being older than 50, drinking alcohol excessively, and having a family history of ulcer disease.  SYMPTOMS   Burning pain or gnawing in the area between the chest and the belly button.  Heartburn.  Nausea and vomiting.  Bloating. The pain can be worse on an empty stomach and at night. If the ulcer results in bleeding, it can cause:  Black, tarry stools.  Vomiting of bright red blood.  Vomiting of coffee-ground-looking materials. DIAGNOSIS  A diagnosis is usually made based upon your history and an exam. Other tests and procedures may be performed to find the cause of the ulcer. Finding a cause will help determine the best treatment. Tests and procedures may include:  Blood tests, stool tests, or breath tests to check for the bacterium H. pylori.  An upper gastrointestinal (  GI) series of the esophagus, stomach, and small intestine.  An endoscopy to examine the esophagus, stomach, and small intestine.  A biopsy. TREATMENT  Treatment may include:  Eliminating the cause of the ulcer, such as smoking, NSAIDs, or alcohol.  Medicines to reduce the amount of acid in your digestive tract.  Antibiotic medicines if  the ulcer is caused by the H. pylori bacterium.  An upper endoscopy to treat a bleeding ulcer.  Surgery if the bleeding is severe or if the ulcer created a hole somewhere in the digestive system. HOME CARE INSTRUCTIONS   Avoid tobacco, alcohol, and caffeine. Smoking can increase the acid in the stomach, and continued smoking will impair the healing of ulcers.  Avoid foods and drinks that seem to cause discomfort or aggravate your ulcer.  Only take medicines as directed by your caregiver. Do not substitute over-the-counter medicines for prescription medicines without talking to your caregiver.  Keep any follow-up appointments and tests as directed. SEEK MEDICAL CARE IF:   Your do not improve within 7 days of starting treatment.  You have ongoing indigestion or heartburn. SEEK IMMEDIATE MEDICAL CARE IF:   You have sudden, sharp, or persistent abdominal pain.  You have bloody or dark black, tarry stools.  You vomit blood or vomit that looks like coffee grounds.  You become light-headed, weak, or feel faint.  You become sweaty or clammy. MAKE SURE YOU:   Understand these instructions.  Will watch your condition.  Will get help right away if you are not doing well or get worse.   This information is not intended to replace advice given to you by your health care provider. Make sure you discuss any questions you have with your health care provider.   Document Released: 07/02/2000 Document Revised: 07/26/2014 Document Reviewed: 02/02/2012 Elsevier Interactive Patient Education 2016 Elsevier Inc. Esophagitis Esophagitis is inflammation of the esophagus. The esophagus is the tube that carries food and liquids from your mouth to your stomach. Esophagitis can cause soreness or pain in the esophagus. This condition can make it difficult and painful to swallow.  CAUSES Most causes of esophagitis are not serious. Common causes of this condition include:  Gastroesophageal reflux  disease (GERD). This is when stomach contents move back up into the esophagus (reflux).  Repeated vomiting.  An allergic-type reaction, especially caused by food allergies (eosinophilic esophagitis).  Injury to the esophagus by swallowing large pills with or without water, or swallowing certain types of medicines.  Swallowing (ingesting) harmful chemicals, such as household cleaning products.  Heavy alcohol use.  An infection of the esophagus.This most often occurs in people who have a weakened immune system.  Radiation or chemotherapy treatment for cancer.  Certain diseases such as sarcoidosis, Crohn disease, and scleroderma. SYMPTOMS Symptoms of this condition include:  Difficult or painful swallowing.  Pain with swallowing acidic liquids, such as citrus juices.  Pain with burping.  Chest pain.  Difficulty breathing.  Nausea.  Vomiting.  Pain in the abdomen.  Weight loss.  Ulcers in the mouth.  Patches of white material in the mouth (candidiasis).  Fever.  Coughing up blood or vomiting blood.  Stool that is black, tarry, or bright red. DIAGNOSIS Your health care provider will take a medical history and perform a physical exam. You may also have other tests, including:  An endoscopy to examine your stomach and esophagus with a small camera.  A test that measures the acidity level in your esophagus.  A test that measures how much  pressure is on your esophagus.  A barium swallow or modified barium swallow to show the shape, size, and functioning of your esophagus.  Allergy tests. TREATMENT Treatment for this condition depends on the cause of your esophagitis. In some cases, steroids or other medicines may be given to help relieve your symptoms or to treat the underlying cause of your condition. You may have to make some lifestyle changes, such as:  Avoiding alcohol.  Quitting smoking.  Changing your diet.  Exercising.  Changing your sleep habits  and your sleep environment. HOME CARE INSTRUCTIONS Take these actions to decrease your discomfort and to help avoid complications. Diet  Follow a diet as recommended by your health care provider. This may involve avoiding foods and drinks such as:  Coffee and tea (with or without caffeine).  Drinks that contain alcohol.  Energy drinks and sports drinks.  Carbonated drinks or sodas.  Chocolate and cocoa.  Peppermint and mint flavorings.  Garlic and onions.  Horseradish.  Spicy and acidic foods, including peppers, chili powder, curry powder, vinegar, hot sauces, and barbecue sauce.  Citrus fruit juices and citrus fruits, such as oranges, lemons, and limes.  Tomato-based foods, such as red sauce, chili, salsa, and pizza with red sauce.  Fried and fatty foods, such as donuts, french fries, potato chips, and high-fat dressings.  High-fat meats, such as hot dogs and fatty cuts of red and white meats, such as rib eye steak, sausage, ham, and bacon.  High-fat dairy items, such as whole milk, butter, and cream cheese.  Eat small, frequent meals instead of large meals.  Avoid drinking large amounts of liquid with your meals.  Avoid eating meals during the 2-3 hours before bedtime.  Avoid lying down right after you eat.  Do not exercise right after you eat.  Avoid foods and drinks that seem to make your symptoms worse. General Instructions  Pay attention to any changes in your symptoms.  Take over-the-counter and prescription medicines only as told by your health care provider. Do not take aspirin, ibuprofen, or other NSAIDs unless your health care provider told you to do so.  If you have trouble taking pills, use a pill splitter to decrease the size of the pill. This will decrease the chance of the pill getting stuck or injuring your esophagus on the way down. Also, drink water after you take a pill.  Do not use any tobacco products, including cigarettes, chewing tobacco,  and e-cigarettes. If you need help quitting, ask your health care provider.  Wear loose-fitting clothing. Do not wear anything tight around your waist that causes pressure on your abdomen.  Raise (elevate) the head of your bed about 6 inches (15 cm).  Try to reduce your stress, such as with yoga or meditation. If you need help reducing stress, ask your health care provider.  If you are overweight, reduce your weight to an amount that is healthy for you. Ask your health care provider for guidance about a safe weight loss goal.  Keep all follow-up visits as told by your health care provider. This is important. SEEK MEDICAL CARE IF:  You have new symptoms.  You have unexplained weight loss.  You have difficulty swallowing, or it hurts to swallow.  You have wheezing or a persistent cough.  Your symptoms do not improve with treatment.  You have frequent heartburn for more than two weeks. SEEK IMMEDIATE MEDICAL CARE IF:  You have severe pain in your arms, neck, jaw, teeth, or back.  You feel sweaty, dizzy, or light-headed.  You have chest pain or shortness of breath.  You vomit and your vomit looks like blood or coffee grounds.  Your stool is bloody or black.  You have a fever.  You cannot swallow, drink, or eat.   This information is not intended to replace advice given to you by your health care provider. Make sure you discuss any questions you have with your health care provider.   Document Released: 08/12/2004 Document Revised: 03/26/2015 Document Reviewed: 10/30/2014 Elsevier Interactive Patient Education 2016 Elsevier Inc. Gastroesophageal Reflux Disease, Adult Normally, food travels down the esophagus and stays in the stomach to be digested. However, when a person has gastroesophageal reflux disease (GERD), food and stomach acid move back up into the esophagus. When this happens, the esophagus becomes sore and inflamed. Over time, GERD can create small holes (ulcers) in  the lining of the esophagus.  CAUSES This condition is caused by a problem with the muscle between the esophagus and the stomach (lower esophageal sphincter, or LES). Normally, the LES muscle closes after food passes through the esophagus to the stomach. When the LES is weakened or abnormal, it does not close properly, and that allows food and stomach acid to go back up into the esophagus. The LES can be weakened by certain dietary substances, medicines, and medical conditions, including:  Tobacco use.  Pregnancy.  Having a hiatal hernia.  Heavy alcohol use.  Certain foods and beverages, such as coffee, chocolate, onions, and peppermint. RISK FACTORS This condition is more likely to develop in:  People who have an increased body weight.  People who have connective tissue disorders.  People who use NSAID medicines. SYMPTOMS Symptoms of this condition include:  Heartburn.  Difficult or painful swallowing.  The feeling of having a lump in the throat.  Abitter taste in the mouth.  Bad breath.  Having a large amount of saliva.  Having an upset or bloated stomach.  Belching.  Chest pain.  Shortness of breath or wheezing.  Ongoing (chronic) cough or a night-time cough.  Wearing away of tooth enamel.  Weight loss. Different conditions can cause chest pain. Make sure to see your health care provider if you experience chest pain. DIAGNOSIS Your health care provider will take a medical history and perform a physical exam. To determine if you have mild or severe GERD, your health care provider may also monitor how you respond to treatment. You may also have other tests, including:  An endoscopy toexamine your stomach and esophagus with a small camera.  A test thatmeasures the acidity level in your esophagus.  A test thatmeasures how much pressure is on your esophagus.  A barium swallow or modified barium swallow to show the shape, size, and functioning of your  esophagus. TREATMENT The goal of treatment is to help relieve your symptoms and to prevent complications. Treatment for this condition may vary depending on how severe your symptoms are. Your health care provider may recommend:  Changes to your diet.  Medicine.  Surgery. HOME CARE INSTRUCTIONS Diet  Follow a diet as recommended by your health care provider. This may involve avoiding foods and drinks such as:  Coffee and tea (with or without caffeine).  Drinks that containalcohol.  Energy drinks and sports drinks.  Carbonated drinks or sodas.  Chocolate and cocoa.  Peppermint and mint flavorings.  Garlic and onions.  Horseradish.  Spicy and acidic foods, including peppers, chili powder, curry powder, vinegar, hot sauces, and barbecue sauce.  Citrus fruit juices and citrus fruits, such as oranges, lemons, and limes.  Tomato-based foods, such as red sauce, chili, salsa, and pizza with red sauce.  Fried and fatty foods, such as donuts, french fries, potato chips, and high-fat dressings.  High-fat meats, such as hot dogs and fatty cuts of red and white meats, such as rib eye steak, sausage, ham, and bacon.  High-fat dairy items, such as whole milk, butter, and cream cheese.  Eat small, frequent meals instead of large meals.  Avoid drinking large amounts of liquid with your meals.  Avoid eating meals during the 2-3 hours before bedtime.  Avoid lying down right after you eat.  Do not exercise right after you eat. General Instructions  Pay attention to any changes in your symptoms.  Take over-the-counter and prescription medicines only as told by your health care provider. Do not take aspirin, ibuprofen, or other NSAIDs unless your health care provider told you to do so.  Do not use any tobacco products, including cigarettes, chewing tobacco, and e-cigarettes. If you need help quitting, ask your health care provider.  Wear loose-fitting clothing. Do not wear  anything tight around your waist that causes pressure on your abdomen.  Raise (elevate) the head of your bed 6 inches (15cm).  Try to reduce your stress, such as with yoga or meditation. If you need help reducing stress, ask your health care provider.  If you are overweight, reduce your weight to an amount that is healthy for you. Ask your health care provider for guidance about a safe weight loss goal.  Keep all follow-up visits as told by your health care provider. This is important. SEEK MEDICAL CARE IF:  You have new symptoms.  You have unexplained weight loss.  You have difficulty swallowing, or it hurts to swallow.  You have wheezing or a persistent cough.  Your symptoms do not improve with treatment.  You have a hoarse voice. SEEK IMMEDIATE MEDICAL CARE IF:  You have pain in your arms, neck, jaw, teeth, or back.  You feel sweaty, dizzy, or light-headed.  You have chest pain or shortness of breath.  You vomit and your vomit looks like blood or coffee grounds.  You faint.  Your stool is bloody or black.  You cannot swallow, drink, or eat.   This information is not intended to replace advice given to you by your health care provider. Make sure you discuss any questions you have with your health care provider.   Document Released: 04/14/2005 Document Revised: 03/26/2015 Document Reviewed: 10/30/2014 Elsevier Interactive Patient Education [employer]2016 Elsevier Inc.

## 2015-05-05 NOTE — H&P (View-Only) (Signed)
Primary Care Physician:  Isabella Stalling, MD Primary Gastroenterologist:  Dr. Jena Gauss  Pre-Procedure History & Physical: HPI:  Suzanne Morton is a 54 y.o. female here for for evaluation of alternating constipation anddiarrhea.  Chronic symptoms for years. Constipated most of the time. Last seen here 2 years ago laboratory workup of ultrasound all negative (status post cholecystectomy and fundoplication for refractory GERD many years ago. No dysphagia. Has epigastric burning for which he takes Maalox daily.  Was doing well on Linzess 290 daily but could not afford it: Constipatedmost of the time has abdominal cramps and diarrhea when she travels and goes out to eat and other stressful situations. Has abdominal bloating and gurgling with the area products. She's gained 4 pounds since her last visit here no significant intercurrent medical problems otherwise.  Negative ileocolonoscopy 2 years ago.  Past Medical History  Diagnosis Date  . Hypertension   . Epistaxis   . HSV-2 (herpes simplex virus 2) infection   . Abnormal Pap smear     Past Surgical History  Procedure Laterality Date  . Tubal ligation    .  funduplication    . Cholecystectomy      Dr. Leona Carry  . Colonoscopy N/A 10/23/2012    Dr. Jena Gauss- normal rectum (anal papilla), colon and terminal ileum    Prior to Admission medications   Medication Sig Start Date End Date Taking? Authorizing Provider  ibuprofen (ADVIL,MOTRIN) 200 MG tablet Take 200 mg by mouth every 6 (six) hours as needed for pain.   Yes Historical Provider, MD  metoprolol succinate (TOPROL-XL) 50 MG 24 hr tablet Take 1 tablet (50 mg total) by mouth daily. Take with or immediately following a meal. 05/22/12  Yes Donnetta Hutching, MD  polyethylene glycol powder (GLYCOLAX/MIRALAX) powder Take 17 g by mouth daily. 10/10/12  Yes Tiffany Kocher, PA-C  Linaclotide (LINZESS) 145 MCG CAPS capsule Take 1 capsule (145 mcg total) by mouth daily. 30 minutes before  breakfast Patient not taking: Reported on 04/15/2015 04/25/13   Nira Retort, NP  Probiotic Product [facility]) CAPS Take 1 capsule by mouth daily.    Historical Provider, MD  SALINE NASAL MIST NA Place 1-2 sprays into the nose daily as needed. As needed for nose bleeds    Historical Provider, MD    Allergies as of 04/15/2015 - Review Complete 04/15/2015  Allergen Reaction Noted  . Ivp dye [iodinated diagnostic agents] Hives, Shortness Of Breath, and Swelling 05/22/2012    Family History  Problem Relation Age of Onset  . Colon cancer Neg Hx   . Lung cancer Mother   . Alcoholism Father     Social History   Social History  . Marital Status: Divorced    Spouse Name: N/A  . Number of Children: 2  . Years of Education: N/A   Occupational History  . Medical illustrator    Social History Main Topics  . Smoking status: Current Every Day Smoker  . Smokeless tobacco: Not on file  . Alcohol Use: No  . Drug Use: No  . Sexual Activity: Yes   Other Topics Concern  . Not on file   Social History Narrative    Review of Systems: See HPI, otherwise negative ROS  Physical Exam: BP 143/84 mmHg  Pulse 81  Temp(Src) 97.2 F (36.2 C)  Ht  (1.626 m)  Wt 126 lb 9.6 oz (57.425 kg)  BMI 21.72 kg/m2 General:   Alert,  Well-developed, well-nourished, pleasant and cooperative in  NAD Skin:  Intact without significant lesions or rashes. Eyes:  Sclera clear, no icterus.   Conjunctiva pink. Neck:  Supple; no masses or thyromegaly. No significant cervical adenopathy. Lungs:  Clear throughout to auscultation.   No wheezes, crackles, or rhonchi. No acute distress. Heart:  Regular rate and rhythm; no murmurs, clicks, rubs,  or gallops. Abdomen: Non-distended, normal bowel sounds.  Soft and nontender without appreciable mass or hepatosplenomegaly.  Pulses:  Normal pulses noted. Extremities:  Without clubbing or edema.  Impression:  Pleasant 54 year old lady with alternating constipation/  diarrhea most consistent with irritable bowel syndrome. She has constipation predominant IBS these days. She also likely has lactase deficiency. Dyspepsia seems to be a separate symptom requiring antacids frequently. Really no dysphagia or typical GERD symptoms. Occasional NSAID use. I discussed the benign but chronic nature of irritable bowel syndrome. Our goal should be to control her symptoms to the point where it does not affect her quality of life. There is no cure. She needs further evaluation of her upper GI tract symptoms   Recommendations:  Begin Linzess 290 daily. Discussed possibility of diarrhea. Samples provided.  Levsin 0.125 mg under the tongue before meals and at bedtime for abdominal cramps / diarrhea (disp  #30) with 11 refills).  Keep a stool diarrhea  Schedule diagnostic EGD - dyspepsia. The risks, benefits, limitations, alternatives and imponderables have been reviewed with the patient. Potential for esophageal dilation, biopsy, etc. have also been reviewed.  Questions have been answered. All parties agreeable.  Information on IBS provided.  Lactose-free diet  Further recommendations to follow   Notice: This dictation was prepared with Dragon dictation along with smaller phrase technology. Any transcriptional errors that result from this process are unintentional and may not be corrected upon review.

## 2015-05-08 ENCOUNTER — Encounter (HOSPITAL_COMMUNITY): Payer: Self-pay | Admitting: Internal Medicine

## 2015-05-11 ENCOUNTER — Encounter: Payer: Self-pay | Admitting: Internal Medicine

## 2015-08-06 ENCOUNTER — Ambulatory Visit (INDEPENDENT_AMBULATORY_CARE_PROVIDER_SITE_OTHER): Payer: Self-pay | Admitting: Gastroenterology

## 2015-08-06 ENCOUNTER — Encounter: Payer: Self-pay | Admitting: Gastroenterology

## 2015-08-06 VITALS — BP 128/73 | HR 84 | Temp 97.6°F | Ht 64.0 in | Wt 124.6 lb

## 2015-08-06 DIAGNOSIS — K5900 Constipation, unspecified: Secondary | ICD-10-CM

## 2015-08-06 DIAGNOSIS — K279 Peptic ulcer, site unspecified, unspecified as acute or chronic, without hemorrhage or perforation: Secondary | ICD-10-CM

## 2015-08-06 NOTE — Patient Instructions (Signed)
We have scheduled you for an upper endoscopy with Dr. Jena Gauss to ensure the small ulcers have healed. Continue Protonix twice a day.   I have given you samples of Linzess 145 mcg. Let's try this every other day to every day. If needed, you can open the capsule and put half in a water or applesauce and take like this. Please let me know if you need a prescription.  We will see you in 6 months!

## 2015-08-06 NOTE — Assessment & Plan Note (Signed)
55 year old female with recent EGD in Oct 2016 noting multiple antral ulcers/erosions, reflux esophagitis. Negative H.pylori. Likely secondary to NSAIDs. She has abstained from this since the EGD. Discussed ulcer surveillance with patient; I highly anticipate this will be healed if not significantly improved due to behavior modification of avoidance of NSAIDs and taking PPI BID.   Proceed with upper endoscopy in the near future with Dr. Jena Gauss. The risks, benefits, and alternatives have been discussed in detail with patient. They have stated understanding and desire to proceed.  Continue Protonix BID: will decrease to daily after EGD if all looks well

## 2015-08-06 NOTE — Assessment & Plan Note (Signed)
Constipation predominant IBS. Appears she may be playing "catch up", as she takes Linzes 290 mcg only as needed after going several days without a BM. In fact, 290 mcg may be too high of a dose, as she notes significant diarrhea with this. Will try the novel approach of 145 mcg daily to every other day, but I have cautioned her against using this only on a prn basis as we are trying to be proactive. I also discussed opening the 145 mcg capsule and using half the dose daily if this works best for her, off-label, as I have seen this work well for some patients. 145 mcg samples provided. Will have this nice lady return in 6 months.

## 2015-08-06 NOTE — Progress Notes (Signed)
Referring Provider: Oval Linsey, MD Primary Care Physician:  Isabella Stalling, MD Primary GI: Dr. Jena Gauss   Chief Complaint  Patient presents with  . Follow-up    HPI:   Suzanne Morton is a 55 y.o. female presenting today with a history of IBS, predominantly constipation but alternates back and forth. History of fundoplication for refractory GERD in remote past. Recent EGD Oct 2016 with erosive reflux esophagitis, multiple antral ulcers/erosions, prior fundoplication intact. Negative H.pylori on path.   Had been taking Ibuprofen but hasn't taken any since the EGD. Protonix BID. Takes Linzess "when she needs it". Will go several days and not have a BM, then take Linzess. Stomach is very noisy. Levsin hasn't helped as much with stomach rumbling.   Past Medical History  Diagnosis Date  . Hypertension   . Epistaxis   . HSV-2 (herpes simplex virus 2) infection   . Abnormal Pap smear     Past Surgical History  Procedure Laterality Date  . Tubal ligation    .  funduplication    . Cholecystectomy      Dr. Leona Carry  . Colonoscopy N/A 10/23/2012    Dr. Jena Gauss- normal rectum (anal papilla), colon and terminal ileum  . Esophagogastroduodenoscopy N/A 05/05/2015    Dr. Jena Gauss: erosive reflux esophagitis, multiple antral ulcers/erosions, prior fundoplication intact. Negative H.pylori     Current Outpatient Prescriptions  Medication Sig Dispense Refill  . hyoscyamine (LEVSIN SL) 0.125 MG SL tablet Place 1 tablet (0.125 mg total) under the tongue 4 (four) times daily -  before meals and at bedtime. 30 tablet 11  . ibuprofen (ADVIL,MOTRIN) 200 MG tablet Take 400-800 mg by mouth every 6 (six) hours as needed for pain.     . Linaclotide (LINZESS) 290 MCG CAPS capsule Take 290 mcg by mouth daily as needed (for constipation).    . metoprolol tartrate (LOPRESSOR) 25 MG tablet Take 25 mg by mouth 2 (two) times daily.    . pantoprazole (PROTONIX) 40 MG tablet Take 1 tablet (40 mg total) by  mouth 2 (two) times daily. 60 tablet 3  . polyethylene glycol powder (GLYCOLAX/MIRALAX) powder Take 17 g by mouth daily as needed for mild constipation or moderate constipation.     Marland Kitchen HYDROcodone-acetaminophen (NORCO/VICODIN) 5-325 MG tablet Take one tab po q 4-6 hrs prn pain (Patient not taking: Reported on 08/06/2015) 15 tablet 0   No current facility-administered medications for this visit.    Allergies as of 08/06/2015 - Review Complete 08/06/2015  Allergen Reaction Noted  . Ivp dye [iodinated diagnostic agents] Hives, Shortness Of Breath, and Swelling 05/22/2012    Family History  Problem Relation Age of Onset  . Colon cancer Neg Hx   . Lung cancer Mother   . Alcoholism Father     Social History   Social History  . Marital Status: Divorced    Spouse Name: N/A  . Number of Children: 2  . Years of Education: N/A   Occupational History  . Medical illustrator    Social History Main Topics  . Smoking status: Current Every Day Smoker  . Smokeless tobacco: None  . Alcohol Use: No  . Drug Use: No  . Sexual Activity: Yes   Other Topics Concern  . None   Social History Narrative    Review of Systems: As mentioned in HPI   Physical Exam: BP 128/73 mmHg  Pulse 84  Temp(Src) 97.6 F (36.4 C) (Oral)  Ht  (1.626 m)  Wt  124 lb 9.6 oz (56.518 kg)  BMI 21.38 kg/m2 General:   Alert and oriented. No distress noted. Pleasant and cooperative.  Head:  Normocephalic and atraumatic. Eyes:  Conjuctiva clear without scleral icterus. Mouth:  Oral mucosa pink and moist. Good dentition. No lesions. Heart:  S1, S2 present without murmurs, rubs, or gallops. Regular rate and rhythm. Abdomen:  +BS, soft, non-tender and non-distended. No rebound or guarding. No HSM or masses noted. Msk:  Symmetrical without gross deformities. Normal posture. Extremities:  Without edema. Neurologic:  Alert and  oriented x4;  grossly normal neurologically. Psych:  Alert and cooperative. Normal  mood and affect.

## 2015-08-06 NOTE — Progress Notes (Signed)
cc'ed to pcp °

## 2015-08-07 ENCOUNTER — Other Ambulatory Visit: Payer: Self-pay

## 2015-08-07 DIAGNOSIS — K279 Peptic ulcer, site unspecified, unspecified as acute or chronic, without hemorrhage or perforation: Secondary | ICD-10-CM

## 2015-08-13 ENCOUNTER — Encounter (HOSPITAL_COMMUNITY)
Admission: RE | Disposition: A | Discharge status: Home / Self Care | Payer: Self-pay | Source: Ambulatory Visit | Attending: Internal Medicine

## 2015-08-13 ENCOUNTER — Ambulatory Visit (HOSPITAL_COMMUNITY)
Admission: RE | Admit: 2015-08-13 | Discharge: 2015-08-13 | Disposition: A | Discharge status: Home / Self Care | Payer: Self-pay | Source: Ambulatory Visit | Attending: Internal Medicine | Admitting: Internal Medicine

## 2015-08-13 ENCOUNTER — Encounter (HOSPITAL_COMMUNITY): Payer: Self-pay | Admitting: *Deleted

## 2015-08-13 DIAGNOSIS — K279 Peptic ulcer, site unspecified, unspecified as acute or chronic, without hemorrhage or perforation: Secondary | ICD-10-CM

## 2015-08-13 DIAGNOSIS — Z79899 Other long term (current) drug therapy: Secondary | ICD-10-CM | POA: Insufficient documentation

## 2015-08-13 DIAGNOSIS — K210 Gastro-esophageal reflux disease with esophagitis: Secondary | ICD-10-CM | POA: Insufficient documentation

## 2015-08-13 DIAGNOSIS — K259 Gastric ulcer, unspecified as acute or chronic, without hemorrhage or perforation: Secondary | ICD-10-CM | POA: Insufficient documentation

## 2015-08-13 DIAGNOSIS — I10 Essential (primary) hypertension: Secondary | ICD-10-CM | POA: Insufficient documentation

## 2015-08-13 HISTORY — PX: ESOPHAGOGASTRODUODENOSCOPY: SHX5428

## 2015-08-13 SURGERY — EGD (ESOPHAGOGASTRODUODENOSCOPY)
Anesthesia: Moderate Sedation

## 2015-08-13 MED ORDER — SODIUM CHLORIDE 0.9 % IV SOLN
INTRAVENOUS | Status: DC
  Administered 2015-08-13: 1000 mL via INTRAVENOUS

## 2015-08-13 MED ORDER — LIDOCAINE VISCOUS 2 % MT SOLN
OROMUCOSAL | Status: DC | PRN
  Administered 2015-08-13: 1 via OROMUCOSAL

## 2015-08-13 MED ORDER — MEPERIDINE HCL 100 MG/ML IJ SOLN
INTRAMUSCULAR | Status: DC | PRN
  Administered 2015-08-13 (×2): 50 mg via INTRAVENOUS

## 2015-08-13 MED ORDER — ONDANSETRON HCL 4 MG/2ML IJ SOLN
INTRAMUSCULAR | Status: AC
  Filled 2015-08-13: qty 2

## 2015-08-13 MED ORDER — MIDAZOLAM HCL 5 MG/5ML IJ SOLN
INTRAMUSCULAR | Status: AC
  Filled 2015-08-13: qty 10

## 2015-08-13 MED ORDER — MEPERIDINE HCL 100 MG/ML IJ SOLN
INTRAMUSCULAR | Status: AC
  Filled 2015-08-13: qty 2

## 2015-08-13 MED ORDER — ONDANSETRON HCL 4 MG/2ML IJ SOLN
INTRAMUSCULAR | Status: DC | PRN
Start: 2015-08-13 — End: 2015-08-13
  Administered 2015-08-13: 4 mg via INTRAVENOUS

## 2015-08-13 MED ORDER — MIDAZOLAM HCL 10 MG/2ML IJ SOLN
INTRAMUSCULAR | Status: DC | PRN
  Administered 2015-08-13: 1 mg via INTRAVENOUS

## 2015-08-13 MED ORDER — LIDOCAINE VISCOUS 2 % MT SOLN
OROMUCOSAL | Status: AC
  Filled 2015-08-13: qty 15

## 2015-08-13 MED ORDER — MIDAZOLAM HCL 5 MG/5ML IJ SOLN
INTRAMUSCULAR | Status: DC | PRN
  Administered 2015-08-13 (×2): 2 mg via INTRAVENOUS

## 2015-08-13 NOTE — Interval H&P Note (Signed)
History and Physical Interval Note:  08/13/2015 2:09 PM  Suzanne Morton  has presented today for surgery, with the diagnosis of ulcer surveillance  The various methods of treatment have been discussed with the patient and family. After consideration of risks, benefits and other options for treatment, the patient has consented to  Procedure(s) with comments: ESOPHAGOGASTRODUODENOSCOPY (EGD) (N/A) - 230  as a surgical intervention .  The patient's history has been reviewed, patient examined, no change in status, stable for surgery.  I have reviewed the patient's chart and labs.  Questions were answered to the patient's satisfaction.     Aristides Luckey  No change. Surveillance EGD per plan.  The risks, benefits, limitations, alternatives and imponderables have been reviewed with the patient. Potential for esophageal dilation, biopsy, etc. have also been reviewed.  Questions have been answered. All parties agreeable.

## 2015-08-13 NOTE — H&P (View-Only) (Signed)
  Referring Provider: Dondiego, Richard, MD Primary Care Physician:  DONDIEGO,RICHARD M, MD Primary GI: Dr. Rourk   Chief Complaint  Patient presents with  . Follow-up    HPI:   Suzanne Morton is a 54 y.o. female presenting today with a history of IBS, predominantly constipation but alternates back and forth. History of fundoplication for refractory GERD in remote past. Recent EGD Oct 2016 with erosive reflux esophagitis, multiple antral ulcers/erosions, prior fundoplication intact. Negative H.pylori on path.   Had been taking Ibuprofen but hasn't taken any since the EGD. Protonix BID. Takes Linzess "when she needs it". Will go several days and not have a BM, then take Linzess. Stomach is very noisy. Levsin hasn't helped as much with stomach rumbling.   Past Medical History  Diagnosis Date  . Hypertension   . Epistaxis   . HSV-2 (herpes simplex virus 2) infection   . Abnormal Pap smear     Past Surgical History  Procedure Laterality Date  . Tubal ligation    .  funduplication    . Cholecystectomy      Dr. Destefano  . Colonoscopy N/A 10/23/2012    Dr. Rourk- normal rectum (anal papilla), colon and terminal ileum  . Esophagogastroduodenoscopy N/A 05/05/2015    Dr. Rourk: erosive reflux esophagitis, multiple antral ulcers/erosions, prior fundoplication intact. Negative H.pylori     Current Outpatient Prescriptions  Medication Sig Dispense Refill  . hyoscyamine (LEVSIN SL) 0.125 MG SL tablet Place 1 tablet (0.125 mg total) under the tongue 4 (four) times daily -  before meals and at bedtime. 30 tablet 11  . ibuprofen (ADVIL,MOTRIN) 200 MG tablet Take 400-800 mg by mouth every 6 (six) hours as needed for pain.     . Linaclotide (LINZESS) 290 MCG CAPS capsule Take 290 mcg by mouth daily as needed (for constipation).    . metoprolol tartrate (LOPRESSOR) 25 MG tablet Take 25 mg by mouth 2 (two) times daily.    . pantoprazole (PROTONIX) 40 MG tablet Take 1 tablet (40 mg total) by  mouth 2 (two) times daily. 60 tablet 3  . polyethylene glycol powder (GLYCOLAX/MIRALAX) powder Take 17 g by mouth daily as needed for mild constipation or moderate constipation.     . HYDROcodone-acetaminophen (NORCO/VICODIN) 5-325 MG tablet Take one tab po q 4-6 hrs prn pain (Patient not taking: Reported on 08/06/2015) 15 tablet 0   No current facility-administered medications for this visit.    Allergies as of 08/06/2015 - Review Complete 08/06/2015  Allergen Reaction Noted  . Ivp dye [iodinated diagnostic agents] Hives, Shortness Of Breath, and Swelling 05/22/2012    Family History  Problem Relation Age of Onset  . Colon cancer Neg Hx   . Lung cancer Mother   . Alcoholism Father     Social History   Social History  . Marital Status: Divorced    Spouse Name: N/A  . Number of Children: 2  . Years of Education: N/A   Occupational History  . Financial office manager    Social History Main Topics  . Smoking status: Current Every Day Smoker  . Smokeless tobacco: None  . Alcohol Use: No  . Drug Use: No  . Sexual Activity: Yes   Other Topics Concern  . None   Social History Narrative    Review of Systems: As mentioned in HPI   Physical Exam: BP 128/73 mmHg  Pulse 84  Temp(Src) 97.6 F (36.4 C) (Oral)  Ht 5' 4" (1.626 m)  Wt   124 lb 9.6 oz (56.518 kg)  BMI 21.38 kg/m2 General:   Alert and oriented. No distress noted. Pleasant and cooperative.  Head:  Normocephalic and atraumatic. Eyes:  Conjuctiva clear without scleral icterus. Mouth:  Oral mucosa pink and moist. Good dentition. No lesions. Heart:  S1, S2 present without murmurs, rubs, or gallops. Regular rate and rhythm. Abdomen:  +BS, soft, non-tender and non-distended. No rebound or guarding. No HSM or masses noted. Msk:  Symmetrical without gross deformities. Normal posture. Extremities:  Without edema. Neurologic:  Alert and  oriented x4;  grossly normal neurologically. Psych:  Alert and cooperative. Normal  mood and affect.  

## 2015-08-13 NOTE — Op Note (Signed)
[facility] [address] [place], [address]   ENDOSCOPY PROCEDURE REPORT  PATIENT: Suzanne, Morton  MR#: [id number] BIRTHDATE: January 03, 1961 , 54  yrs. old GENDER: female ENDOSCOPIST: R.  Roetta Sessions, MD FACP FACG REFERRED BY:  Oval Linsey, M.D. PROCEDURE DATE:  Aug 15, 2015 PROCEDURE:  EGD, diagnostic INDICATIONS:  Follow-up gastric ulcer. MEDICATIONS: Versed 5 mg IV and Demerol 100 mg IV in divided doses. Zofran 4 mg IV.  Xylocaine gel orally. ASA CLASS:      Class II  CONSENT: The risks, benefits, limitations, alternatives and imponderables have been discussed.  The potential for biopsy, esophogeal dilation, etc. have also been reviewed.  Questions have been answered.  All parties agreeable.  Please see the history and physical in the medical record for more information.  DESCRIPTION OF PROCEDURE: After the risks benefits and alternatives of the procedure were thoroughly explained, informed consent was obtained.  The EG-[id number] ([id number]) endoscope was introduced through the mouth and advanced to the second portion of the duodenum , limited by Without limitations. The instrument was slowly withdrawn as the mucosa was fully examined. Estimated blood loss is zero unless otherwise noted in this procedure report.    Normal-appearing tubular esophagus.  Stomach empty.  Patient has an intact and appropriately snug fundoplication.  Previously noted ulcers completely healed with scar formation in the antrum.  Patent pylorus.  Normal-appearing first and second portion of the duodenum.  Retroflexed views revealed no abnormalities.     The scope was then withdrawn from the patient and the procedure completed.  COMPLICATIONS: There were no immediate complications.  ENDOSCOPIC IMPRESSION: Intact Nissen fundoplication. Previously noted gastric ulcers completely healed with scar formation noted  RECOMMENDATIONS: May decrease Protonix to 40 mg once daily. Continue  to avoid nonsteroidals as much as possible. Office visit with [place] in one year.  REPEAT EXAM:  eSigned:  R. Roetta Sessions, MD [facility]rel [facility] 15-Aug-2015 2:37 PM    CC:  CPT CODES: ICD CODES:  The ICD and CPT codes recommended by this software are interpretations from the data that the clinical staff has captured with the software.  The verification of the translation of this report to the ICD and CPT codes and modifiers is the sole responsibility of the health care institution and practicing physician where this report was generated.  [employer] will not be held responsible for the validity of the ICD and CPT codes included on this report.  AMA assumes no liability for data contained or not contained herein. CPT is a Publishing rights manager of the [employer].  PATIENT NAME:  Suzanne, Morton MR#: [id number]

## 2015-08-13 NOTE — Discharge Instructions (Signed)
EGD Discharge instructions Please read the instructions outlined below and refer to this sheet in the next few weeks. These discharge instructions provide you with general information on caring for yourself after you leave the hospital. Your doctor may also give you specific instructions. While your treatment has been planned according to the most current medical practices available, unavoidable complications occasionally occur. If you have any problems or questions after discharge, please call your doctor. ACTIVITY  You may resume your regular activity but move at a slower pace for the next 24 hours.   Take frequent rest periods for the next 24 hours.   Walking will help expel (get rid of) the air and reduce the bloated feeling in your abdomen.   No driving for 24 hours (because of the anesthesia (medicine) used during the test).   You may shower.   Do not sign any important legal documents or operate any machinery for 24 hours (because of the anesthesia used during the test).  NUTRITION  Drink plenty of fluids.   You may resume your normal diet.   Begin with a light meal and progress to your normal diet.   Avoid alcoholic beverages for 24 hours or as instructed by your caregiver.  MEDICATIONS  You may resume your normal medications unless your caregiver tells you otherwise.  WHAT YOU CAN EXPECT TODAY  You may experience abdominal discomfort such as a feeling of fullness or gas pains.  FOLLOW-UP  Your doctor will discuss the results of your test with you.  SEEK IMMEDIATE MEDICAL ATTENTION IF ANY OF THE FOLLOWING OCCUR:  Excessive nausea (feeling sick to your stomach) and/or vomiting.   Severe abdominal pain and distention (swelling).   Trouble swallowing.   Temperature over 101 F (37.8 C).   Rectal bleeding or vomiting of blood.   May decrease Protonix to 40 mg once daily  Would avoid nonsteroidal agents as much as possible  Office visit with [place] in one  year.

## 2015-08-15 ENCOUNTER — Encounter (HOSPITAL_COMMUNITY): Payer: Self-pay | Admitting: Internal Medicine

## 2015-10-03 ENCOUNTER — Other Ambulatory Visit: Payer: Self-pay | Admitting: Internal Medicine

## 2015-10-15 ENCOUNTER — Ambulatory Visit: Payer: Self-pay | Admitting: Obstetrics and Gynecology

## 2015-10-16 ENCOUNTER — Ambulatory Visit: Payer: Self-pay | Admitting: Obstetrics & Gynecology

## 2015-10-17 ENCOUNTER — Ambulatory Visit (HOSPITAL_COMMUNITY)
Admission: RE | Admit: 2015-10-17 | Discharge: 2015-10-17 | Disposition: A | Discharge status: Home / Self Care | Payer: Self-pay | Source: Ambulatory Visit | Attending: Family Medicine | Admitting: Family Medicine

## 2015-10-17 ENCOUNTER — Other Ambulatory Visit (HOSPITAL_COMMUNITY): Payer: Self-pay | Admitting: Family Medicine

## 2015-10-17 DIAGNOSIS — I700 Atherosclerosis of aorta: Secondary | ICD-10-CM | POA: Insufficient documentation

## 2015-10-17 DIAGNOSIS — M545 Low back pain, unspecified: Secondary | ICD-10-CM

## 2015-10-17 DIAGNOSIS — R109 Unspecified abdominal pain: Secondary | ICD-10-CM

## 2015-10-17 DIAGNOSIS — I708 Atherosclerosis of other arteries: Secondary | ICD-10-CM | POA: Insufficient documentation

## 2015-10-17 NOTE — Progress Notes (Signed)
Report called to Dr Janna Archondiego on cell phone. He acknowledge report. I gave him patient's cell phone number so he could call her the report./ tsf

## 2015-10-28 ENCOUNTER — Ambulatory Visit (INDEPENDENT_AMBULATORY_CARE_PROVIDER_SITE_OTHER): Payer: Self-pay | Admitting: Obstetrics & Gynecology

## 2015-10-28 ENCOUNTER — Other Ambulatory Visit (HOSPITAL_COMMUNITY)
Admission: RE | Admit: 2015-10-28 | Discharge: 2015-10-28 | Disposition: A | Discharge status: Home / Self Care | Payer: Self-pay | Source: Ambulatory Visit | Attending: Obstetrics & Gynecology | Admitting: Obstetrics & Gynecology

## 2015-10-28 ENCOUNTER — Encounter: Payer: Self-pay | Admitting: Obstetrics & Gynecology

## 2015-10-28 VITALS — BP 120/90 | HR 80 | Ht 64.0 in | Wt 129.0 lb

## 2015-10-28 DIAGNOSIS — R319 Hematuria, unspecified: Secondary | ICD-10-CM

## 2015-10-28 DIAGNOSIS — Z01411 Encounter for gynecological examination (general) (routine) with abnormal findings: Secondary | ICD-10-CM | POA: Insufficient documentation

## 2015-10-28 DIAGNOSIS — Z01419 Encounter for gynecological examination (general) (routine) without abnormal findings: Secondary | ICD-10-CM

## 2015-10-28 DIAGNOSIS — Z1212 Encounter for screening for malignant neoplasm of rectum: Secondary | ICD-10-CM

## 2015-10-28 DIAGNOSIS — Z1211 Encounter for screening for malignant neoplasm of colon: Secondary | ICD-10-CM

## 2015-10-28 DIAGNOSIS — Z1151 Encounter for screening for human papillomavirus (HPV): Secondary | ICD-10-CM | POA: Insufficient documentation

## 2015-10-28 LAB — POCT URINALYSIS DIPSTICK
Blood, UA: NEGATIVE
GLUCOSE UA: NEGATIVE
Leukocytes, UA: NEGATIVE
Nitrite, UA: NEGATIVE

## 2015-10-28 NOTE — Progress Notes (Signed)
Patient ID: Suzanne Morton, female   DOB: 05-17-1961, 55 y.o.   MRN: [id number] Subjective:     Suzanne Morton is a 55 y.o. female here for a routine exam.  No LMP recorded. Patient is postmenopausal. G2P2 Birth Control Method:  menopausal Menstrual Calendar(currently): none  Current complaints: recent probable renal calculus.   Current acute medical issues:  none   Recent Gynecologic History No LMP recorded. Patient is postmenopausal. Last Pap: 2014,  normal Last mammogram: 11/15/2014,  normal  Past Medical History  Diagnosis Date  . Hypertension   . Epistaxis   . HSV-2 (herpes simplex virus 2) infection   . Abnormal Pap smear     Past Surgical History  Procedure Laterality Date  . Tubal ligation    .  funduplication    . Cholecystectomy      Dr. Leona Carryestefano  . Colonoscopy N/A 10/23/2012    Dr. Jena Gaussourk- normal rectum (anal papilla), colon and terminal ileum  . Esophagogastroduodenoscopy N/A 05/05/2015    Dr. Jena Gaussourk: erosive reflux esophagitis, multiple antral ulcers/erosions, prior fundoplication intact. Negative H.pylori   . Esophagogastroduodenoscopy N/A 08/13/2015    Procedure: ESOPHAGOGASTRODUODENOSCOPY (EGD);  Surgeon: Corbin Adeobert M Rourk, MD;  Location: [facility];  Service: Endoscopy;  Laterality: N/A;  230     OB History    Gravida Para Term Preterm AB TAB SAB Ectopic Multiple Living   2 2        2       Social History   Social History  . Marital Status: Divorced    Spouse Name: N/A  . Number of Children: 2  . Years of Education: N/A   Occupational History  . Medical illustratorinancial office manager    Social History Main Topics  . Smoking status: Current Every Day Smoker -- 30 years    Types: Cigarettes  . Smokeless tobacco: None     Comment: smokes 4 - 5 cigarettes  . Alcohol Use: No  . Drug Use: No  . Sexual Activity: Yes   Other Topics Concern  . None   Social History Narrative    Family History  Problem Relation Age of Onset  . Colon cancer Neg Hx   . Lung  cancer Mother   . Alcoholism Father   . Cancer Sister      Current outpatient prescriptions:  .  metoprolol tartrate (LOPRESSOR) 25 MG tablet, Take 25 mg by mouth 2 (two) times daily., Disp: , Rfl:  .  pantoprazole (PROTONIX) 40 MG tablet, TAKE 1 TABLET BY MOUTH TWICE DAILY, Disp: 60 tablet, Rfl: 3 .  Probiotic Product (ALIGN PO), Take 1 capsule by mouth daily., Disp: , Rfl:  .  Linaclotide (LINZESS) 290 MCG CAPS capsule, Take 290 mcg by mouth daily as needed (for constipation). Reported on 10/28/2015, Disp: , Rfl:   Review of Systems  Review of Systems  Constitutional: Negative for fever, chills, weight loss, malaise/fatigue and diaphoresis.  HENT: Negative for hearing loss, ear pain, nosebleeds, congestion, sore throat, neck pain, tinnitus and ear discharge.   Eyes: Negative for blurred vision, double vision, photophobia, pain, discharge and redness.  Respiratory: Negative for cough, hemoptysis, sputum production, shortness of breath, wheezing and stridor.   Cardiovascular: Negative for chest pain, palpitations, orthopnea, claudication, leg swelling and PND.  Gastrointestinal: negative for abdominal pain. Negative for heartburn, nausea, vomiting, diarrhea, constipation, blood in stool and melena.  Genitourinary: Negative for dysuria, urgency, frequency, hematuria and flank pain.  Musculoskeletal: Negative for myalgias, back pain, joint pain and  falls.  Skin: Negative for itching and rash.  Neurological: Negative for dizziness, tingling, tremors, sensory change, speech change, focal weakness, seizures, loss of consciousness, weakness and headaches.  Endo/Heme/Allergies: Negative for environmental allergies and polydipsia. Does not bruise/bleed easily.  Psychiatric/Behavioral: Negative for depression, suicidal ideas, hallucinations, memory loss and substance abuse. The patient is not nervous/anxious and does not have insomnia.        Objective:  Blood pressure 120/90, pulse 80, height   (1.626 m), weight 129 lb (58.514 kg).   Physical Exam  Vitals reviewed. Constitutional: She is oriented to person, place, and time. She appears well-developed and well-nourished.  HENT:  Head: Normocephalic and atraumatic.        Right Ear: External ear normal.  Left Ear: External ear normal.  Nose: Nose normal.  Mouth/Throat: Oropharynx is clear and moist.  Eyes: Conjunctivae and EOM are normal. Pupils are equal, round, and reactive to light. Right eye exhibits no discharge. Left eye exhibits no discharge. No scleral icterus.  Neck: Normal range of motion. Neck supple. No tracheal deviation present. No thyromegaly present.  Cardiovascular: Normal rate, regular rhythm, normal heart sounds and intact distal pulses.  Exam reveals no gallop and no friction rub.   No murmur heard. Respiratory: Effort normal and breath sounds normal. No respiratory distress. She has no wheezes. She has no rales. She exhibits no tenderness.  GI: Soft. Bowel sounds are normal. She exhibits no distension and no mass. There is no tenderness. There is no rebound and no guarding.  Genitourinary:  Breasts no masses skin changes or nipple changes bilaterally      Vulva is normal without lesions Vagina is pink moist without discharge Cervix normal in appearance and pap is done Uterus is normal size shape and contour Adnexa is negative with normal sized ovaries  {Rectal    hemoccult negative, normal tone, no masses  Musculoskeletal: Normal range of motion. She exhibits no edema and no tenderness.  Neurological: She is alert and oriented to person, place, and time. She has normal reflexes. She displays normal reflexes. No cranial nerve deficit. She exhibits normal muscle tone. Coordination normal.  Skin: Skin is warm and dry. No rash noted. No erythema. No pallor.  Psychiatric: She has a normal mood and affect. Her behavior is normal. Judgment and thought content normal.       Medications Ordered at today's  visit: No orders of the defined types were placed in this encounter.    Other orders placed at today's visit: Orders Placed This Encounter  Procedures  . POCT urinalysis dipstick      Assessment:    Healthy female exam.    Plan:    Mammogram ordered. Follow up in: 1 year.

## 2015-10-28 NOTE — Addendum Note (Signed)
Addended by: Federico FlakeNES, Merek Niu A on: 10/28/2015 10:45 AM   Modules accepted: Orders

## 2015-10-30 LAB — CYTOLOGY - PAP

## 2015-11-03 ENCOUNTER — Telehealth: Payer: Self-pay | Admitting: *Deleted

## 2015-11-03 NOTE — Telephone Encounter (Signed)
Pt informed of abnormal pap (ASCUS, +HPV) from 10/28/2015. Pt informed Colposcopy recommended, call transferred to front staff to discuss charges due to pt not having insurance and to schedule Colposcopy.

## 2015-11-03 NOTE — Telephone Encounter (Signed)
noted 

## 2015-11-17 ENCOUNTER — Other Ambulatory Visit: Payer: Self-pay | Admitting: Neurology

## 2015-11-17 ENCOUNTER — Other Ambulatory Visit (HOSPITAL_COMMUNITY): Payer: Self-pay | Admitting: Neurology

## 2015-11-17 DIAGNOSIS — R413 Other amnesia: Secondary | ICD-10-CM

## 2015-11-21 ENCOUNTER — Ambulatory Visit (HOSPITAL_COMMUNITY)
Admission: RE | Admit: 2015-11-21 | Discharge: 2015-11-21 | Disposition: A | Discharge status: Home / Self Care | Payer: Self-pay | Source: Ambulatory Visit | Attending: Neurology | Admitting: Neurology

## 2015-11-21 ENCOUNTER — Other Ambulatory Visit (HOSPITAL_COMMUNITY)
Admission: RE | Admit: 2015-11-21 | Discharge: 2015-11-21 | Disposition: A | Discharge status: Home / Self Care | Payer: Self-pay | Source: Ambulatory Visit | Attending: Neurology | Admitting: Neurology

## 2015-11-21 DIAGNOSIS — E559 Vitamin D deficiency, unspecified: Secondary | ICD-10-CM | POA: Insufficient documentation

## 2015-11-21 DIAGNOSIS — E538 Deficiency of other specified B group vitamins: Secondary | ICD-10-CM | POA: Insufficient documentation

## 2015-11-21 DIAGNOSIS — R413 Other amnesia: Secondary | ICD-10-CM | POA: Insufficient documentation

## 2015-11-21 DIAGNOSIS — I10 Essential (primary) hypertension: Secondary | ICD-10-CM | POA: Insufficient documentation

## 2015-11-21 DIAGNOSIS — Z79899 Other long term (current) drug therapy: Secondary | ICD-10-CM | POA: Insufficient documentation

## 2015-11-21 DIAGNOSIS — M818 Other osteoporosis without current pathological fracture: Secondary | ICD-10-CM | POA: Insufficient documentation

## 2015-11-21 LAB — CBC WITH DIFFERENTIAL/PLATELET
BASOS ABS: 0 10*3/uL (ref 0.0–0.1)
Basophils Relative: 0 %
Eosinophils Absolute: 0.1 10*3/uL (ref 0.0–0.7)
Eosinophils Relative: 1 %
HCT: 40.6 % (ref 36.0–46.0)
HEMOGLOBIN: 13.3 g/dL (ref 12.0–15.0)
LYMPHS ABS: 3 10*3/uL (ref 0.7–4.0)
LYMPHS PCT: 35 %
MCH: 30.1 pg (ref 26.0–34.0)
MCHC: 32.8 g/dL (ref 30.0–36.0)
MCV: 91.9 fL (ref 78.0–100.0)
Monocytes Absolute: 0.6 10*3/uL (ref 0.1–1.0)
Monocytes Relative: 7 %
NEUTROS PCT: 57 %
Neutro Abs: 4.8 10*3/uL (ref 1.7–7.7)
Platelets: 363 10*3/uL (ref 150–400)
RBC: 4.42 MIL/uL (ref 3.87–5.11)
RDW: 13.1 % (ref 11.5–15.5)
WBC: 8.5 10*3/uL (ref 4.0–10.5)

## 2015-11-21 LAB — TSH: TSH: 0.512 u[IU]/mL (ref 0.350–4.500)

## 2015-11-21 LAB — COMPREHENSIVE METABOLIC PANEL
ALK PHOS: 85 U/L (ref 38–126)
ALT: 15 U/L (ref 14–54)
AST: 15 U/L (ref 15–41)
Albumin: 4.2 g/dL (ref 3.5–5.0)
Anion gap: 7 (ref 5–15)
BUN: 9 mg/dL (ref 6–20)
CALCIUM: 9.2 mg/dL (ref 8.9–10.3)
CO2: 27 mmol/L (ref 22–32)
CREATININE: 0.67 mg/dL (ref 0.44–1.00)
Chloride: 104 mmol/L (ref 101–111)
Glucose, Bld: 110 mg/dL — ABNORMAL HIGH (ref 65–99)
Potassium: 3.5 mmol/L (ref 3.5–5.1)
SODIUM: 138 mmol/L (ref 135–145)
Total Bilirubin: 0.2 mg/dL — ABNORMAL LOW (ref 0.3–1.2)
Total Protein: 7.3 g/dL (ref 6.5–8.1)

## 2015-11-21 LAB — VITAMIN B12: Vitamin B-12: 164 pg/mL — ABNORMAL LOW (ref 180–914)

## 2015-11-21 LAB — C-REACTIVE PROTEIN: CRP: 1.4 mg/dL — ABNORMAL HIGH (ref <1.0)

## 2015-11-21 LAB — SEDIMENTATION RATE: SED RATE: 11 mm/h (ref 0–22)

## 2015-11-22 LAB — VITAMIN D 25 HYDROXY (VIT D DEFICIENCY, FRACTURES): Vit D, 25-Hydroxy: 15.2 ng/mL — ABNORMAL LOW (ref 30.0–100.0)

## 2015-11-22 LAB — HEMOGLOBIN A1C
Hgb A1c MFr Bld: 5.6 % (ref 4.8–5.6)
Mean Plasma Glucose: 114 mg/dL

## 2015-11-24 LAB — ANTINUCLEAR ANTIBODIES, IFA: ANA Ab, IFA: NEGATIVE

## 2015-11-25 LAB — METHYLMALONIC ACID, SERUM: METHYLMALONIC ACID, QUANTITATIVE: 297 nmol/L (ref 0–378)

## 2015-11-27 ENCOUNTER — Encounter: Payer: Self-pay | Admitting: Obstetrics & Gynecology

## 2015-11-30 NOTE — Progress Notes (Signed)
This encounter was created in error - please disregard.

## 2015-12-02 ENCOUNTER — Telehealth: Payer: Self-pay | Admitting: *Deleted

## 2015-12-02 NOTE — Telephone Encounter (Signed)
Pt informed per Dr. Despina HiddenEure TSH not causing the memory loss. Pt verbalized understanding.

## 2015-12-25 ENCOUNTER — Encounter: Payer: Self-pay | Admitting: Internal Medicine

## 2016-01-05 ENCOUNTER — Encounter: Payer: Self-pay | Admitting: Obstetrics & Gynecology

## 2016-01-07 ENCOUNTER — Other Ambulatory Visit (HOSPITAL_COMMUNITY)
Admission: RE | Admit: 2016-01-07 | Discharge: 2016-01-07 | Disposition: A | Discharge status: Home / Self Care | Payer: Self-pay | Source: Ambulatory Visit | Attending: Neurology | Admitting: Neurology

## 2016-01-07 DIAGNOSIS — R5383 Other fatigue: Secondary | ICD-10-CM | POA: Insufficient documentation

## 2016-01-07 DIAGNOSIS — E039 Hypothyroidism, unspecified: Secondary | ICD-10-CM | POA: Insufficient documentation

## 2016-01-07 DIAGNOSIS — R413 Other amnesia: Secondary | ICD-10-CM | POA: Insufficient documentation

## 2016-01-07 LAB — TSH: TSH: 0.643 u[IU]/mL (ref 0.350–4.500)

## 2016-01-08 LAB — T4: T4 TOTAL: 7 ug/dL (ref 4.5–12.0)

## 2016-01-13 ENCOUNTER — Encounter: Payer: Self-pay | Admitting: Obstetrics & Gynecology

## 2016-04-01 NOTE — Progress Notes (Signed)
This encounter was created in error - please disregard.

## 2016-06-17 ENCOUNTER — Other Ambulatory Visit: Payer: Self-pay | Admitting: Nurse Practitioner

## 2016-06-29 ENCOUNTER — Encounter: Payer: Self-pay | Admitting: Internal Medicine

## 2016-11-07 IMAGING — DX DG FOOT COMPLETE 3+V*R*
3 series · 3 of 3 positions shown · non-contrast
Comparison: 04/26/2015

CLINICAL DATA: Right foot pain and swelling for 3 weeks, no known
injury

EXAM:
RIGHT FOOT COMPLETE - 3+ VIEW

[foot ap]
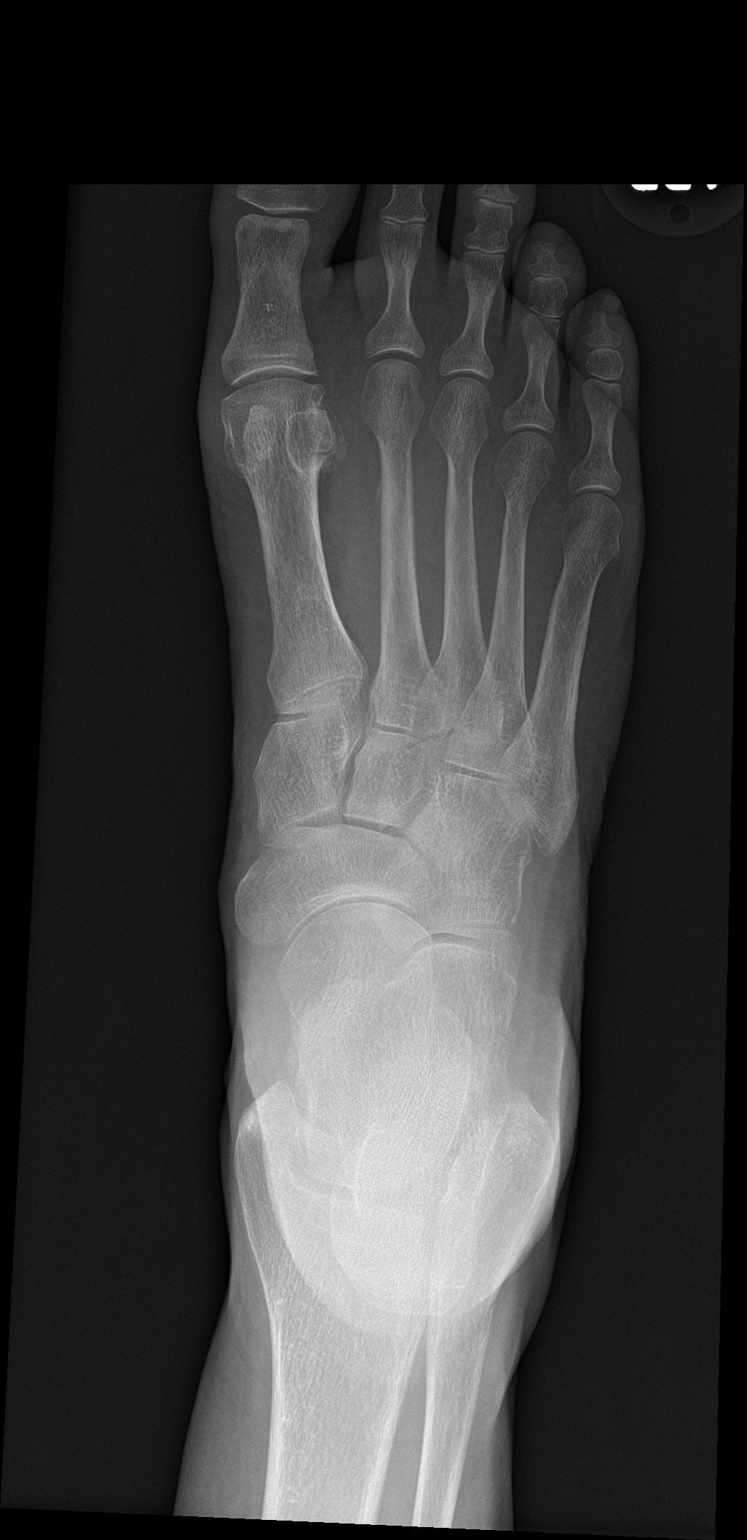

[foot obl]
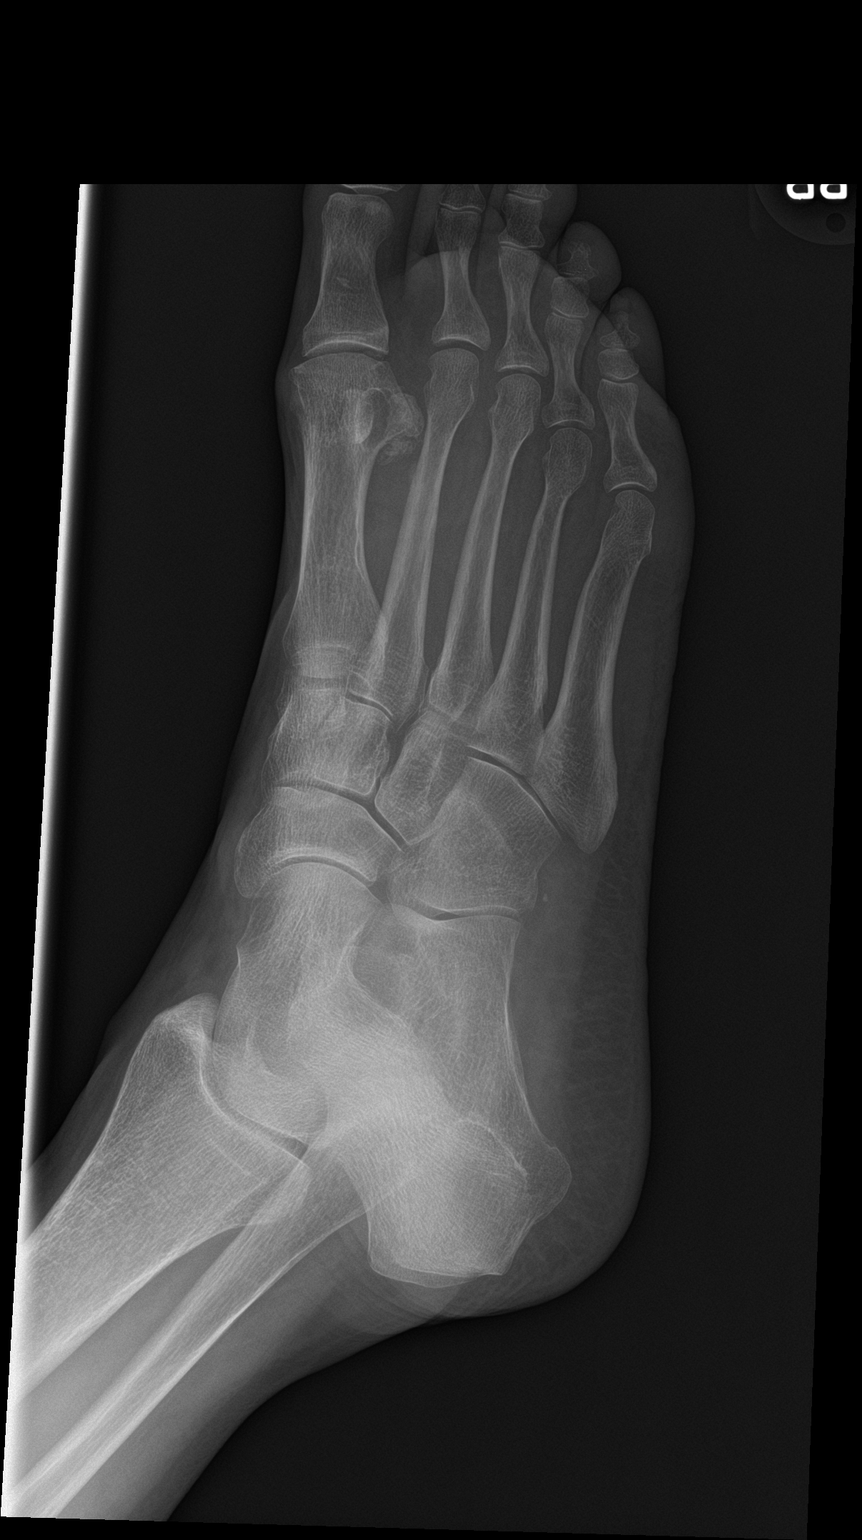

[foot lat]
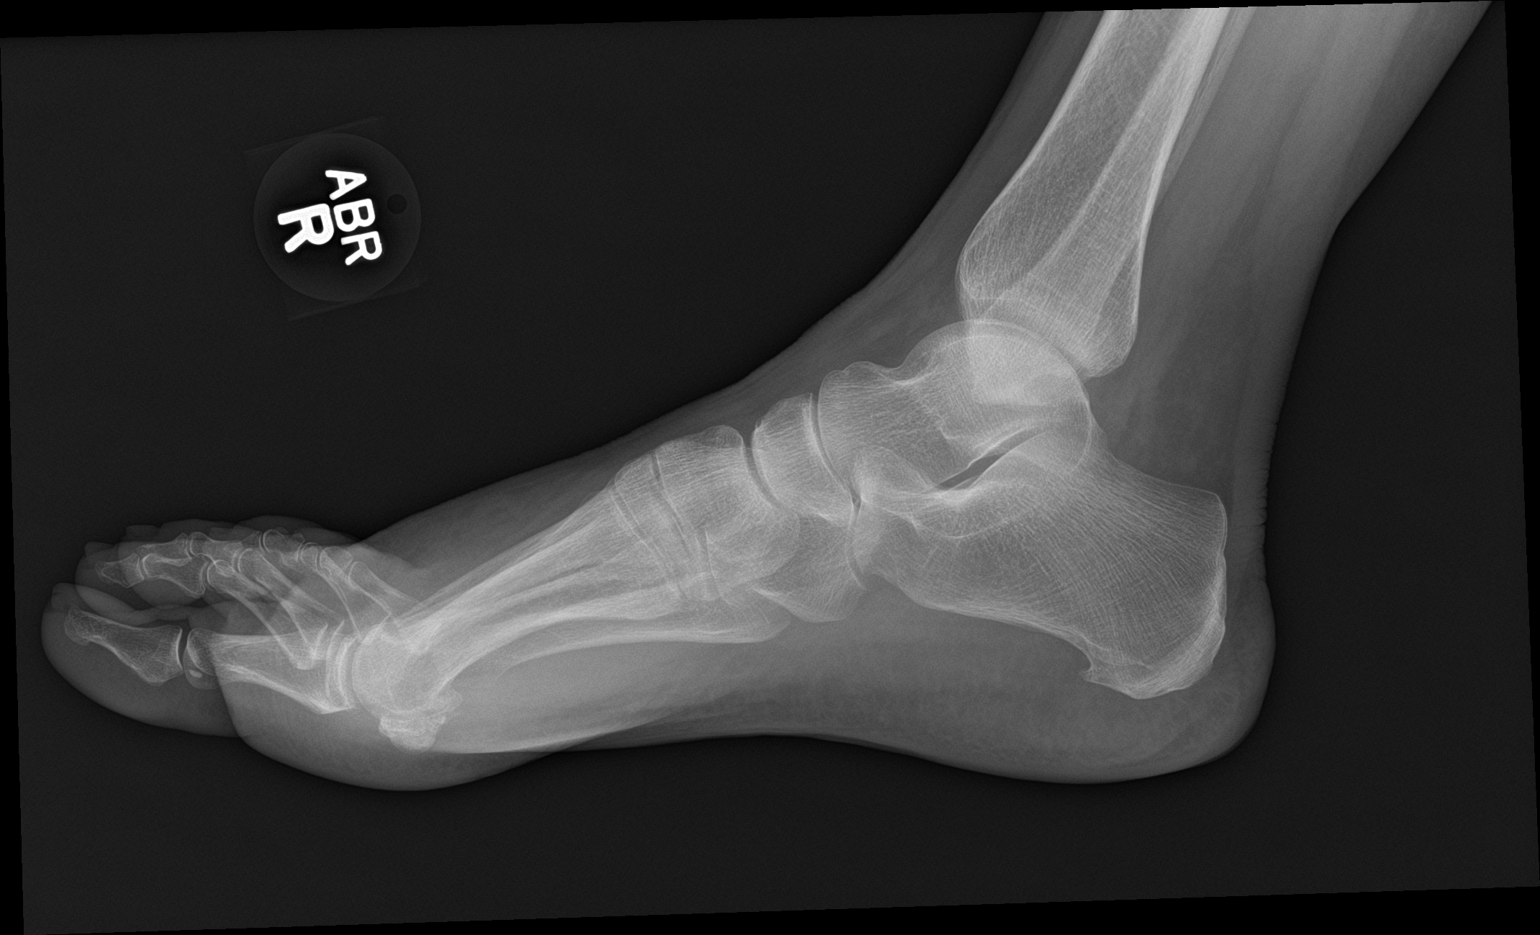

[3 of 3 positions shown; findings below may reference images not displayed]

FINDINGS: Three views of the right foot submitted. No acute fracture or
subluxation. Tiny plantar spur of calcaneus.
IMPRESSION: No acute fracture or subluxation.  Tiny plantar spur of calcaneus.

## 2017-02-22 ENCOUNTER — Other Ambulatory Visit: Payer: Self-pay

## 2017-02-22 MED ORDER — PANTOPRAZOLE SODIUM 40 MG PO TBEC: 40.0000 mg | DELAYED_RELEASE_TABLET | Freq: Two times a day (BID) | ORAL | 3 refills | Status: DC

## 2017-02-25 ENCOUNTER — Ambulatory Visit: Payer: Self-pay | Admitting: Family Medicine

## 2017-03-25 ENCOUNTER — Other Ambulatory Visit: Payer: Self-pay | Admitting: Obstetrics & Gynecology

## 2017-03-30 ENCOUNTER — Ambulatory Visit (INDEPENDENT_AMBULATORY_CARE_PROVIDER_SITE_OTHER): Payer: 59 | Admitting: Family Medicine

## 2017-03-30 ENCOUNTER — Encounter: Payer: Self-pay | Admitting: Family Medicine

## 2017-03-30 VITALS — BP 148/92 | HR 84 | Temp 97.9°F | Resp 16 | Ht 64.0 in | Wt 124.1 lb

## 2017-03-30 DIAGNOSIS — I10 Essential (primary) hypertension: Secondary | ICD-10-CM | POA: Diagnosis not present

## 2017-03-30 DIAGNOSIS — Z23 Encounter for immunization: Secondary | ICD-10-CM | POA: Diagnosis not present

## 2017-03-30 DIAGNOSIS — F17210 Nicotine dependence, cigarettes, uncomplicated: Secondary | ICD-10-CM | POA: Insufficient documentation

## 2017-03-30 DIAGNOSIS — E785 Hyperlipidemia, unspecified: Secondary | ICD-10-CM | POA: Diagnosis not present

## 2017-03-30 DIAGNOSIS — E538 Deficiency of other specified B group vitamins: Secondary | ICD-10-CM

## 2017-03-30 DIAGNOSIS — E559 Vitamin D deficiency, unspecified: Secondary | ICD-10-CM

## 2017-03-30 DIAGNOSIS — Z720 Tobacco use: Secondary | ICD-10-CM | POA: Insufficient documentation

## 2017-03-30 MED ORDER — VARENICLINE TARTRATE 1 MG PO TABS: 1.0000 mg | ORAL_TABLET | Freq: Two times a day (BID) | ORAL | 2 refills | Status: DC

## 2017-03-30 NOTE — Patient Instructions (Signed)
Need labs today I will send you a letter with your test results.  If there is anything of concern, we will call right away.  Need old records I am glad you are trying to quit smoking  See me in a month for a PE

## 2017-03-30 NOTE — Progress Notes (Signed)
Chief Complaint  Patient presents with  . Follow-up    est   New patient Hypertension on lopressor 25 BID.  Not well controlled today, but she states usually her BP is good She has hyperlipidemia but stopped pravastatin due to muscle pain.  Untreated History of ulcers/GERD/fundoplication surgery.  Is on protonix 40 mg a day.  history of vitamin B12 and vitamin D deficiency when last checked -  Not on supplement. Complains of forgetfulness.  She is "stressed to the max".  Works long hours and cares for a disabled brother. I have discussed the multiple health risks associated with cigarette smoking including, but not limited to, cardiovascular disease, lung disease and cancer.  I have strongly recommended that smoking be stopped.  I have reviewed the various methods of quitting including cold [place], classes, nicotine replacements and prescription medications.  I have offered assistance in this difficult process.  The patient is interested in assistance at this time.  She accepts a Rx for chantix with instructions Sees Dr Kendell Baneourke for GI Has  GYN and is up to date with PAP/mammo   Patient Active Problem List   Diagnosis Date Noted  . Tobacco abuse 03/30/2017  . HLD (hyperlipidemia) 03/30/2017  . Essential hypertension 03/30/2017  . Reflux esophagitis   . Peptic ulcer   . HSV-2 (herpes simplex virus 2) infection 10/23/2012  . Constipation 10/10/2012    Outpatient Encounter Prescriptions as of 03/30/2017  Medication Sig  . metoprolol tartrate (LOPRESSOR) 25 MG tablet Take 25 mg by mouth 2 (two) times daily.  . pantoprazole (PROTONIX) 40 MG tablet Take 1 tablet (40 mg total) by mouth 2 (two) times daily.  . varenicline (CHANTIX) 1 MG tablet Take 1 tablet (1 mg total) by mouth 2 (two) times daily.   No facility-administered encounter medications on file as of 03/30/2017.     Allergies  Allergen Reactions  . Ivp Dye [Iodinated Diagnostic Agents] Hives, Shortness Of Breath and Swelling     Review of Systems  Constitutional: Negative for activity change, appetite change and unexpected weight change.  HENT: Negative for congestion, dental problem, postnasal drip and rhinorrhea.   Eyes: Negative for redness and visual disturbance.  Respiratory: Negative for cough and shortness of breath.   Cardiovascular: Negative for chest pain, palpitations and leg swelling.  Gastrointestinal: Negative for abdominal pain, constipation and diarrhea.  Genitourinary: Negative for difficulty urinating, frequency and vaginal bleeding.  Musculoskeletal: Negative for arthralgias and back pain.  Neurological: Positive for numbness. Negative for dizziness and headaches.       "funny" sensation in legs/feet  Psychiatric/Behavioral: Positive for decreased concentration. Negative for dysphoric mood and sleep disturbance. The patient is not nervous/anxious.        "stress"    BP (!) 148/92 (BP Location: Right Arm, Patient Position: Sitting, Cuff Size: Normal)   Pulse 84   Temp 97.9 F (36.6 C) (Temporal)   Resp 16   Ht 5\' 4"  (1.626 m)   Wt 124 lb 1.9 oz (56.3 kg)   SpO2 98%   BMI 21.31 kg/m   Physical Exam  Constitutional: She is oriented to person, place, and time. She appears well-developed and well-nourished.  HENT:  Head: Normocephalic and atraumatic.  Mouth/Throat: Oropharynx is clear and moist.  Eyes: Pupils are equal, round, and reactive to light. Conjunctivae are normal.  Neck: Normal range of motion. Neck supple.  Cardiovascular: Normal rate, regular rhythm and normal heart sounds.   Pulmonary/Chest: Effort normal and breath sounds normal.  No respiratory distress.  Abdominal: Soft. Bowel sounds are normal.  Musculoskeletal: Normal range of motion. She exhibits no edema.  Neurological: She is alert and oriented to person, place, and time.  Gait normal  Skin: Skin is warm and dry.  Psychiatric: She has a normal mood and affect. Her behavior is normal. Thought content normal.    Nursing note and vitals reviewed.   ASSESSMENT/PLAN:  1. Need for influenza vaccination  2. Tobacco abuse  3. Hyperlipidemia, unspecified hyperlipidemia type - CBC - COMPLETE METABOLIC PANEL WITH GFR - Lipid panel - VITAMIN D 25 Hydroxy (Vit-D Deficiency, Fractures) - Urinalysis, Routine w reflex microscopic - Vitamin B12  4. Essential hypertension  5. Vitamin D deficiency  6. Vitamin B 12 deficiency    Patient Instructions  Need labs today I will send you a letter with your test results.  If there is anything of concern, we will call right away.  Need old records I am glad you are trying to quit smoking  See me in a month for a PE   Eustace Moore, MD

## 2017-03-31 LAB — COMPLETE METABOLIC PANEL WITH GFR
AG Ratio: 1.5 (calc) (ref 1.0–2.5)
ALBUMIN MSPROF: 4.2 g/dL (ref 3.6–5.1)
ALKALINE PHOSPHATASE (APISO): 77 U/L (ref 33–130)
ALT: 12 U/L (ref 6–29)
AST: 15 U/L (ref 10–35)
BILIRUBIN TOTAL: 0.5 mg/dL (ref 0.2–1.2)
BUN: 7 mg/dL (ref 7–25)
CHLORIDE: 103 mmol/L (ref 98–110)
CO2: 26 mmol/L (ref 20–32)
Calcium: 9.4 mg/dL (ref 8.6–10.4)
Creat: 0.55 mg/dL (ref 0.50–1.05)
GFR, EST AFRICAN AMERICAN: 123 mL/min/{1.73_m2} (ref >=60)
GFR, Est Non African American: 106 mL/min/{1.73_m2} (ref >=60)
GLOBULIN: 2.8 g/dL (ref 1.9–3.7)
Glucose, Bld: 75 mg/dL (ref 65–99)
Potassium: 3.9 mmol/L (ref 3.5–5.3)
SODIUM: 139 mmol/L (ref 135–146)
TOTAL PROTEIN: 7 g/dL (ref 6.1–8.1)

## 2017-03-31 LAB — CBC
HEMATOCRIT: 40.5 % (ref 35.0–45.0)
HEMOGLOBIN: 13.6 g/dL (ref 11.7–15.5)
MCH: 29.8 pg (ref 27.0–33.0)
MCHC: 33.6 g/dL (ref 32.0–36.0)
MCV: 88.8 fL (ref 80.0–100.0)
MPV: 10.4 fL (ref 7.5–12.5)
Platelets: 341 10*3/uL (ref 140–400)
RBC: 4.56 10*6/uL (ref 3.80–5.10)
RDW: 12 % (ref 11.0–15.0)
WBC: 7 10*3/uL (ref 3.8–10.8)

## 2017-03-31 LAB — LIPID PANEL
CHOL/HDL RATIO: 4.5 (calc) (ref <5.0)
CHOLESTEROL: 221 mg/dL — AB (ref <200)
HDL: 49 mg/dL — ABNORMAL LOW (ref >50)
LDL CHOLESTEROL (CALC): 140 mg/dL — AB
Non-HDL Cholesterol (Calc): 172 mg/dL (calc) — ABNORMAL HIGH (ref <130)
Triglycerides: 188 mg/dL — ABNORMAL HIGH (ref <150)

## 2017-03-31 LAB — VITAMIN B12: Vitamin B-12: 342 pg/mL (ref 200–1100)

## 2017-03-31 LAB — URINALYSIS, ROUTINE W REFLEX MICROSCOPIC
BILIRUBIN URINE: NEGATIVE
Bacteria, UA: NONE SEEN /HPF
GLUCOSE, UA: NEGATIVE
HYALINE CAST: NONE SEEN /LPF
KETONES UR: NEGATIVE
LEUKOCYTES UA: NEGATIVE
Nitrite: NEGATIVE
PROTEIN: NEGATIVE
RBC / HPF: NONE SEEN /HPF (ref 0–2)
Specific Gravity, Urine: 1.015 (ref 1.001–1.03)
pH: 5.5 (ref 5.0–8.0)

## 2017-03-31 LAB — VITAMIN D 25 HYDROXY (VIT D DEFICIENCY, FRACTURES): Vit D, 25-Hydroxy: 28 ng/mL — ABNORMAL LOW (ref 30–100)

## 2017-04-05 ENCOUNTER — Telehealth: Payer: Self-pay | Admitting: Family Medicine

## 2017-04-05 NOTE — Telephone Encounter (Signed)
Pt called to review lab work, as a fyi she was on pravastatin a few years ago and it gave her leg cramps.

## 2017-04-05 NOTE — Telephone Encounter (Signed)
Requesting a call from you to discuss her lab results [phone]

## 2017-04-05 NOTE — Telephone Encounter (Signed)
She has a current risk of developing heart disease of 18 % over the next 10 yrs.  The recommendation is to start a Statin if the risk is over 10.  It is safe to try a different statin even if she had cramping from the pravastatin.  I recommend she try low dose crestor ( rosuvastatin) and let me know if she has muscle pain.

## 2017-04-06 MED ORDER — ROSUVASTATIN CALCIUM 5 MG PO TABS
5.0000 mg | ORAL_TABLET | Freq: Every day | ORAL | 1 refills | Status: DC
Start: 2017-04-06 — End: 2017-10-31

## 2017-04-06 MED ORDER — NICODERM CQ 21 MG/24HR TD PT24: 21.0000 mg | MEDICATED_PATCH | Freq: Every day | TRANSDERMAL | 0 refills | Status: DC

## 2017-04-06 NOTE — Telephone Encounter (Signed)
Pt aware.

## 2017-04-06 NOTE — Telephone Encounter (Signed)
lmtcb

## 2017-05-17 ENCOUNTER — Ambulatory Visit (INDEPENDENT_AMBULATORY_CARE_PROVIDER_SITE_OTHER): Payer: 59 | Admitting: Family Medicine

## 2017-05-17 ENCOUNTER — Encounter: Payer: Self-pay | Admitting: Family Medicine

## 2017-05-17 VITALS — BP 146/94 | HR 82 | Temp 98.1°F | Resp 16 | Ht 64.0 in | Wt 124.0 lb

## 2017-05-17 DIAGNOSIS — E538 Deficiency of other specified B group vitamins: Secondary | ICD-10-CM

## 2017-05-17 DIAGNOSIS — I10 Essential (primary) hypertension: Secondary | ICD-10-CM | POA: Diagnosis not present

## 2017-05-17 DIAGNOSIS — K2100 Gastro-esophageal reflux disease with esophagitis, without bleeding: Secondary | ICD-10-CM

## 2017-05-17 DIAGNOSIS — E559 Vitamin D deficiency, unspecified: Secondary | ICD-10-CM | POA: Diagnosis not present

## 2017-05-17 DIAGNOSIS — E785 Hyperlipidemia, unspecified: Secondary | ICD-10-CM | POA: Diagnosis not present

## 2017-05-17 MED ORDER — LINACLOTIDE 145 MCG PO CAPS: 145.0000 ug | ORAL_CAPSULE | Freq: Every day | ORAL | 3 refills | Status: DC

## 2017-05-17 MED ORDER — METOPROLOL TARTRATE 25 MG PO TABS: 25.0000 mg | ORAL_TABLET | Freq: Two times a day (BID) | ORAL | 3 refills | Status: DC

## 2017-05-17 NOTE — Progress Notes (Signed)
Chief Complaint  Patient presents with  . Annual Exam   BP is up, did not take medicine yet this morning Goes to GYN for annual pelvic Health screen up to date Flu shot last month No new complaints still has cramping in feet when sakes up in the morning Labs reviewed, mild vitamin D deficiency     Patient Active Problem List   Diagnosis Date Noted  . Tobacco abuse 03/30/2017  . HLD (hyperlipidemia) 03/30/2017  . Essential hypertension 03/30/2017  . Reflux esophagitis   . Peptic ulcer   . HSV-2 (herpes simplex virus 2) infection 10/23/2012  . Constipation 10/10/2012    Outpatient Encounter Prescriptions as of 05/17/2017  Medication Sig  . metoprolol tartrate (LOPRESSOR) 25 MG tablet Take 1 tablet (25 mg total) by mouth 2 (two) times daily.  . pantoprazole (PROTONIX) 40 MG tablet Take 1 tablet (40 mg total) by mouth 2 (two) times daily.  . rosuvastatin (CRESTOR) 5 MG tablet Take 1 tablet (5 mg total) by mouth daily.  Marland Kitchen linaclotide (LINZESS) 145 MCG CAPS capsule Take 1 capsule (145 mcg total) by mouth daily before breakfast.  . NICODERM CQ 21 MG/24HR patch Place 1 patch (21 mg total) onto the skin daily. (Patient not taking: Reported on 05/17/2017)   No facility-administered encounter medications on file as of 05/17/2017.     Allergies  Allergen Reactions  . Ivp Dye [Iodinated Diagnostic Agents] Hives, Shortness Of Breath and Swelling    Review of Systems  Constitutional: Negative for activity change, appetite change and unexpected weight change.  HENT: Negative for congestion, dental problem, postnasal drip and rhinorrhea.   Eyes: Negative for redness and visual disturbance.  Respiratory: Negative for cough and shortness of breath.   Cardiovascular: Negative for chest pain, palpitations and leg swelling.  Gastrointestinal: Negative for abdominal pain, constipation and diarrhea.  Genitourinary: Negative for difficulty urinating, frequency and vaginal bleeding.    Musculoskeletal: Positive for myalgias. Negative for arthralgias and back pain.       Muscle cramps feet an d ankles  Neurological: Negative for dizziness, numbness and headaches.  Psychiatric/Behavioral: Positive for decreased concentration. Negative for dysphoric mood and sleep disturbance. The patient is not nervous/anxious.        "stress"    BP (!) 146/94 (BP Location: Right Arm, Patient Position: Sitting, Cuff Size: Normal)   Pulse 82   Temp 98.1 F (36.7 C) (Temporal)   Resp 16   Ht 5\' 4"  (1.626 m)   Wt 124 lb 0.6 oz (56.3 kg)   SpO2 98%   BMI 21.29 kg/m   Physical Exam  BP (!) 146/94 (BP Location: Right Arm, Patient Position: Sitting, Cuff Size: Normal)   Pulse 82   Temp 98.1 F (36.7 C) (Temporal)   Resp 16   Ht 5\' 4"  (1.626 m)   Wt 124 lb 0.6 oz (56.3 kg)   SpO2 98%   BMI 21.29 kg/m   General Appearance:    Alert, cooperative, no distress, appears stated age  Head:    Normocephalic, without obvious abnormality, atraumatic  Eyes:    PERRL, conjunctiva/corneas clear, EOM's intact, fundi    benign, both eyes  Ears:    Normal TM's and external ear canals, both ears  Nose:   Nares normal, septum midline, mucosa normal, no drainage    or sinus tenderness  Throat:   Lips, mucosa, and tongue normal; teeth and gums normal  Neck:   Supple, symmetrical, trachea midline, no adenopathy;  thyroid:  no enlargement/tenderness/nodules; no carotid   bruit  Back:     Symmetric, no curvature, ROM normal, no CVA tenderness  Lungs:     Clear to auscultation bilaterally, respirations unlabored  Chest Wall:    No tenderness or deformity   Heart:    Regular rate and rhythm, S1 and S2 normal, no murmur, rub   or gallop  Breast Exam:    No tenderness, masses, or nipple abnormality  Abdomen:     Soft, non-tender, bowel sounds active all four quadrants,    no masses, no organomegaly  Extremities:   Extremities normal, atraumatic, no cyanosis or edema  Pulses:   2+ and symmetric all  extremities, pedal pulses good  Skin:   Skin color, texture, turgor normal, no rashes or lesions  Lymph nodes:   Cervical, supraclavicular, and axillary nodes normal  Neurologic:   Normal strength, sensation and reflexes    throughout     ASSESSMENT/PLAN:  1. Vitamin D deficiency  2. Essential hypertension Not controlled, didn't take med  3. Hyperlipidemia, unspecified hyperlipidemia type On crestor  4. Reflux esophagitis controlled  5. Vitamin B 12 deficiency Discuss replacement   Patient Instructions  Take the crestor at night Take the linzess as directed Try to stop smoking Exercise every day that you are able See me every 6 months    Eustace MooreYvonne Sue Donney Caraveo, MD

## 2017-05-17 NOTE — Patient Instructions (Signed)
Take the crestor at night Take the linzess as directed Try to stop smoking Exercise every day that you are able See me every 6 months

## 2017-10-27 LAB — LIPID PANEL
CHOL/HDL RATIO: 3.3 (calc) (ref <5.0)
CHOLESTEROL: 162 mg/dL (ref <200)
HDL: 49 mg/dL — ABNORMAL LOW (ref >50)
LDL CHOLESTEROL (CALC): 84 mg/dL
NON-HDL CHOLESTEROL (CALC): 113 mg/dL (ref <130)
Triglycerides: 196 mg/dL — ABNORMAL HIGH (ref <150)

## 2017-10-27 LAB — COMPLETE METABOLIC PANEL WITH GFR
AG Ratio: 1.9 (calc) (ref 1.0–2.5)
ALBUMIN MSPROF: 4.6 g/dL (ref 3.6–5.1)
ALT: 19 U/L (ref 6–29)
AST: 22 U/L (ref 10–35)
Alkaline phosphatase (APISO): 84 U/L (ref 33–130)
BUN: 7 mg/dL (ref 7–25)
CALCIUM: 9.7 mg/dL (ref 8.6–10.4)
CO2: 31 mmol/L (ref 20–32)
CREATININE: 0.63 mg/dL (ref 0.50–1.05)
Chloride: 104 mmol/L (ref 98–110)
GFR, EST AFRICAN AMERICAN: 117 mL/min/{1.73_m2} (ref >=60)
GFR, EST NON AFRICAN AMERICAN: 101 mL/min/{1.73_m2} (ref >=60)
GLUCOSE: 71 mg/dL (ref 65–99)
Globulin: 2.4 g/dL (calc) (ref 1.9–3.7)
Potassium: 4.4 mmol/L (ref 3.5–5.3)
SODIUM: 142 mmol/L (ref 135–146)
TOTAL PROTEIN: 7 g/dL (ref 6.1–8.1)
Total Bilirubin: 0.4 mg/dL (ref 0.2–1.2)

## 2017-10-27 LAB — URINALYSIS, ROUTINE W REFLEX MICROSCOPIC
BILIRUBIN URINE: NEGATIVE
Bacteria, UA: NONE SEEN /HPF
Glucose, UA: NEGATIVE
Ketones, ur: NEGATIVE
LEUKOCYTES UA: NEGATIVE
NITRITE: NEGATIVE
Protein, ur: NEGATIVE
SPECIFIC GRAVITY, URINE: 1.026 (ref 1.001–1.03)

## 2017-10-27 LAB — CBC
HEMATOCRIT: 41.5 % (ref 35.0–45.0)
HEMOGLOBIN: 14.3 g/dL (ref 11.7–15.5)
MCH: 30.6 pg (ref 27.0–33.0)
MCHC: 34.5 g/dL (ref 32.0–36.0)
MCV: 88.7 fL (ref 80.0–100.0)
MPV: 10.9 fL (ref 7.5–12.5)
Platelets: 289 10*3/uL (ref 140–400)
RBC: 4.68 10*6/uL (ref 3.80–5.10)
RDW: 11.8 % (ref 11.0–15.0)
WBC: 6.8 10*3/uL (ref 3.8–10.8)

## 2017-10-27 LAB — VITAMIN B12: VITAMIN B 12: 529 pg/mL (ref 200–1100)

## 2017-10-27 LAB — VITAMIN D 25 HYDROXY (VIT D DEFICIENCY, FRACTURES): Vit D, 25-Hydroxy: 21 ng/mL — ABNORMAL LOW (ref 30–100)

## 2017-10-31 ENCOUNTER — Encounter: Payer: Self-pay | Admitting: Family Medicine

## 2017-10-31 ENCOUNTER — Other Ambulatory Visit: Payer: Self-pay

## 2017-10-31 ENCOUNTER — Ambulatory Visit (INDEPENDENT_AMBULATORY_CARE_PROVIDER_SITE_OTHER): Payer: 59 | Admitting: Family Medicine

## 2017-10-31 ENCOUNTER — Telehealth: Payer: Self-pay | Admitting: Family Medicine

## 2017-10-31 VITALS — BP 118/80 | HR 80 | Temp 97.8°F | Resp 14 | Ht 64.0 in | Wt 121.0 lb

## 2017-10-31 DIAGNOSIS — Z720 Tobacco use: Secondary | ICD-10-CM | POA: Diagnosis not present

## 2017-10-31 DIAGNOSIS — E785 Hyperlipidemia, unspecified: Secondary | ICD-10-CM | POA: Diagnosis not present

## 2017-10-31 DIAGNOSIS — E559 Vitamin D deficiency, unspecified: Secondary | ICD-10-CM

## 2017-10-31 DIAGNOSIS — I10 Essential (primary) hypertension: Secondary | ICD-10-CM | POA: Diagnosis not present

## 2017-10-31 MED ORDER — ALBUTEROL SULFATE HFA 108 (90 BASE) MCG/ACT IN AERS
2.0000 | INHALATION_SPRAY | Freq: Four times a day (QID) | RESPIRATORY_TRACT | 0 refills | Status: DC | PRN
Start: 2017-10-31 — End: 2018-04-11

## 2017-10-31 MED ORDER — METOPROLOL TARTRATE 25 MG PO TABS: 25.0000 mg | ORAL_TABLET | Freq: Two times a day (BID) | ORAL | 1 refills | Status: DC

## 2017-10-31 MED ORDER — ESCITALOPRAM OXALATE 10 MG PO TABS: 10.0000 mg | ORAL_TABLET | Freq: Every day | ORAL | 1 refills | Status: DC

## 2017-10-31 MED ORDER — LINACLOTIDE 145 MCG PO CAPS: 145.0000 ug | ORAL_CAPSULE | Freq: Every day | ORAL | 1 refills | Status: DC | PRN

## 2017-10-31 MED ORDER — ROSUVASTATIN CALCIUM 5 MG PO TABS: 5.0000 mg | ORAL_TABLET | Freq: Every day | ORAL | 1 refills | Status: DC

## 2017-10-31 NOTE — Telephone Encounter (Signed)
Patient notified of Dr.Nelson's recommendation with verbal understanding. Patient is in agreement with treatment plan. Lexapro 10 mg QD x 30 tabs w/ 1 refill [facility].

## 2017-10-31 NOTE — Telephone Encounter (Signed)
She did not request.  Medication would be daily lexapro 10 mg.  Needs follow up with Dr Leron CroakHager in 6 weeks.

## 2017-10-31 NOTE — Patient Instructions (Addendum)
Take vitamin D 4000 u a day We will re check in 6 months Blood pressure is great Cholesterol is good Stop smoking - even if you have to vape! Try albuterol before you exercise See Dr Tracie HarrierHagler in 6 months

## 2017-10-31 NOTE — Telephone Encounter (Signed)
Please advise. Thank you   - S.  

## 2017-10-31 NOTE — Progress Notes (Signed)
Chief Complaint  Patient presents with  . vitamin d deficiency    Patient is here for routine follow-up. She has hypertension that is well controlled. She was found to have hyperlipidemia and was started on Crestor. She was found to be vitamin D deficient in vitamin B12, so she was started on supplements She had repeat lab tests and the results are discussed with her Results for Suzanne Morton, Suzanne Morton (MRN [id number]) as of 10/31/2017 11:10  Ref. Range 03/30/2017 10:00 10/26/2017 08:54  Cholesterol Latest Ref Range: <200 mg/dL 409 (H) 811  HDL Cholesterol Latest Ref Range: >50 mg/dL 49 (L) 49 (L)  LDL Cholesterol (Calc) Latest Units: mg/dL (calc) 914 (H) 84  Non-HDL Cholesterol (Calc) Latest Ref Range: <130 mg/dL (calc) 782 (H) 956  Triglycerides Latest Ref Range: <150 mg/dL 213 (H) 086 (H)  Alkaline phosphatase (APISO) Latest Ref Range: 33 - 130 U/L 77 84  Vitamin D, 25-Hydroxy Latest Ref Range: 30 - 100 ng/mL 28 (L) 21 (L)  Vitamin B12 Latest Ref Range: 200 - 1,100 pg/mL 342 529  There have been improvements in all values measured. We discussed today her sleep patterns.  She does not sleep well.  She is often tired.  She states she awakens every night in a panic with rapid heartbeat and not breathing about an hour after she goes to sleep.  Then takes a 30 minutes to get back to sleep.  It usually happens once a night.  It usually happens every night. We discussed her tobacco use.  She states she got nicotine patches to try to quit smoking.  She feels like they were "too strong".  They caused her to have tachycardia.  She inquires about vaping.  I told her that this is treating one bad habit for another.  Treating one terrible habit for one is only slightly less terrible.  She states she is going to try taping with no nicotine and then taper this over time.  She states the primary reason she continues to smoke is "stressed". She tells me that when she exerts herself she has wheezing.  She notices  this when she is doing weed eating or heavy yard work.  We discussed that she probably has some lung damage from the cigarette smoking, early COPD.  I am giving her an albuterol inhaler with instructions for use.  Patient Active Problem List   Diagnosis Date Noted  . Tobacco abuse 03/30/2017  . HLD (hyperlipidemia) 03/30/2017  . Essential hypertension 03/30/2017  . Reflux esophagitis   . Peptic ulcer   . HSV-2 (herpes simplex virus 2) infection 10/23/2012  . Constipation 10/10/2012    Outpatient Encounter Medications as of 10/31/2017  Medication Sig  . B Complex Vitamins (VITAMIN-B COMPLEX PO) Take 1 tablet by mouth daily.  . Cholecalciferol (VITAMIN D PO) Take 1 tablet by mouth daily.  . methocarbamol (ROBAXIN) 750 MG tablet Take 750 mg by mouth daily as needed.  . naproxen (NAPROSYN) 500 MG tablet Take 500 mg by mouth 2 (two) times daily as needed for pain.  . pantoprazole (PROTONIX) 40 MG tablet Take 1 tablet (40 mg total) by mouth 2 (two) times daily.  Marland Kitchen albuterol (PROVENTIL HFA;VENTOLIN HFA) 108 (90 Base) MCG/ACT inhaler Inhale 2 puffs into the lungs every 6 (six) hours as needed for wheezing or shortness of breath.  . linaclotide (LINZESS) 145 MCG CAPS capsule Take 1 capsule (145 mcg total) by mouth daily as needed.  . metoprolol tartrate (LOPRESSOR) 25 MG tablet  Take 1 tablet (25 mg total) by mouth 2 (two) times daily.  . rosuvastatin (CRESTOR) 5 MG tablet Take 1 tablet (5 mg total) by mouth daily.   No facility-administered encounter medications on file as of 10/31/2017.     Allergies  Allergen Reactions  . Ivp Dye [Iodinated Diagnostic Agents] Hives, Shortness Of Breath and Swelling    Review of Systems  Constitutional: Negative for activity change, appetite change and unexpected weight change.  HENT: Negative for congestion, dental problem, postnasal drip and rhinorrhea.   Eyes: Negative for redness and visual disturbance.  Respiratory: Positive for shortness of breath.  Negative for cough.   Cardiovascular: Negative for chest pain, palpitations and leg swelling.  Gastrointestinal: Negative for abdominal pain, constipation and diarrhea.  Genitourinary: Negative for difficulty urinating, frequency and vaginal bleeding.  Musculoskeletal: Positive for myalgias. Negative for arthralgias and back pain.       Muscle cramps feet and ankles persist  Neurological: Negative for dizziness, numbness and headaches.  Psychiatric/Behavioral: Positive for decreased concentration and sleep disturbance. Negative for dysphoric mood. The patient is not nervous/anxious.        "stress"    Physical Exam  Constitutional: She is oriented to person, place, and time. She appears well-developed and well-nourished.  HENT:  Head: Normocephalic and atraumatic.  Mouth/Throat: Oropharynx is clear and moist.  Eyes: Pupils are equal, round, and reactive to light. Conjunctivae are normal.  Neck: Normal range of motion. Neck supple.  Cardiovascular: Normal rate, regular rhythm and normal heart sounds.  Pulmonary/Chest: Effort normal and breath sounds normal. No respiratory distress. She has no wheezes.  Abdominal: Soft. Bowel sounds are normal.  Musculoskeletal: Normal range of motion. She exhibits no edema.  Neurological: She is alert and oriented to person, place, and time.  Gait normal  Skin: Skin is warm and dry.  Psychiatric: She has a normal mood and affect. Her behavior is normal. Thought content normal.  Nursing note and vitals reviewed.   BP 118/80   Pulse 80   Temp 97.8 F (36.6 C) (Temporal)   Resp 14   Ht 5\' 4"  (1.626 m)   Wt 121 lb (54.9 kg)   SpO2 96%   BMI 20.77 kg/m    ASSESSMENT/PLAN:  1. Vitamin D deficiency On replacement.  Level still low.  We will double dose.  2. Essential hypertension Well-controlled  3. Hyperlipidemia, unspecified hyperlipidemia type Well-controlled  4. Tobacco abuse Continues.  Discussed with patient.  She plans on stopping  nicotine products and cigarettes in favor of  vaping.   Patient Instructions  Take vitamin D 4000 u a day We will re check in 6 months Blood pressure is great Cholesterol is good Stop smoking - even if you have to vape! Try albuterol before you exercise See Dr Tracie HarrierHagler in 6 months   Eustace MooreYvonne Sue Aayliah Rotenberry, MD

## 2017-10-31 NOTE — Telephone Encounter (Signed)
Patient asked at checkout if Dr.Nelson was going to send her in some medication for her panic attacks.

## 2017-11-07 ENCOUNTER — Other Ambulatory Visit: Payer: Self-pay

## 2017-11-07 ENCOUNTER — Other Ambulatory Visit: Payer: Self-pay | Admitting: Family Medicine

## 2017-11-07 ENCOUNTER — Telehealth: Payer: Self-pay | Admitting: Family Medicine

## 2017-11-07 MED ORDER — PROMETHAZINE-DM 6.25-15 MG/5ML PO SYRP: 5.0000 mL | ORAL_SOLUTION | Freq: Four times a day (QID) | ORAL | 0 refills | Status: DC | PRN

## 2017-11-07 MED ORDER — PREDNISONE 20 MG PO TABS: 20.0000 mg | ORAL_TABLET | Freq: Two times a day (BID) | ORAL | 0 refills | Status: DC

## 2017-11-07 NOTE — Telephone Encounter (Signed)
Called patient and advised her of Dr.Nelson's recommendation of Prednisone 20 mg twice daily x 5 days with verbal understanding. Prescription called into patients preferred pharmacy. Patient also would like to know if there is something that could be called in for her cough or if there is something over the counter she could take. I let her know I would send a message to Dr.Nelson asking about cough medicine.

## 2017-11-07 NOTE — Telephone Encounter (Signed)
Started Wednesday right after she seen us, Dry cough to a raspy cough, now her chest hurts (lungs) not feeling well, can you call her in something.

## 2017-11-07 NOTE — Telephone Encounter (Signed)
Sent to the pharmacy for her 

## 2017-11-07 NOTE — Telephone Encounter (Signed)
Call her in prednisone 20 BID for 5 days

## 2017-11-08 NOTE — Telephone Encounter (Signed)
Spoke with patient and let her know Dr.Nelson had call in something for cough with verbal understanding.

## 2017-11-15 ENCOUNTER — Ambulatory Visit: Payer: 59 | Admitting: Family Medicine

## 2017-12-22 ENCOUNTER — Encounter: Payer: Self-pay | Admitting: Family Medicine

## 2017-12-23 ENCOUNTER — Encounter: Payer: Self-pay | Admitting: Family Medicine

## 2017-12-26 ENCOUNTER — Other Ambulatory Visit: Payer: Self-pay | Admitting: Family Medicine

## 2017-12-26 NOTE — Telephone Encounter (Signed)
Needs OV prior to future refills. Janine Limboachel H. Tracie HarrierHagler, MD

## 2017-12-27 NOTE — Telephone Encounter (Signed)
Message sent to front staff to call patient and schedule appt to see Dr.Hagler

## 2018-01-25 ENCOUNTER — Telehealth: Payer: Self-pay | Admitting: Family Medicine

## 2018-01-25 NOTE — Telephone Encounter (Signed)
Left message requesting  call back.

## 2018-01-25 NOTE — Telephone Encounter (Signed)
Please call and advise that she would need to be seen and evaluated. Janine Limboachel H. Tracie HarrierHagler, MD

## 2018-01-25 NOTE — Telephone Encounter (Signed)
Patient called in requesting medication for chest cold/ upper respiratory problems. She said she was seen in the ER Sunday and received an inhaler & prednisone, but she is still short of breathe. Cb# [phone]

## 2018-01-26 NOTE — Telephone Encounter (Signed)
Pt is returning your call

## 2018-01-26 NOTE — Telephone Encounter (Signed)
Called patient and advised of Dr.Hagler's recommendations with verbal understanding. Offered an appt for today however patient was unable to take the appt do to work schedule.

## 2018-02-10 ENCOUNTER — Ambulatory Visit (INDEPENDENT_AMBULATORY_CARE_PROVIDER_SITE_OTHER): Payer: 59 | Admitting: Family Medicine

## 2018-02-10 ENCOUNTER — Encounter: Payer: Self-pay | Admitting: Family Medicine

## 2018-02-10 ENCOUNTER — Other Ambulatory Visit: Payer: Self-pay

## 2018-02-10 VITALS — BP 128/78 | HR 90 | Temp 98.4°F | Resp 14 | Ht 64.0 in | Wt 119.1 lb

## 2018-02-10 DIAGNOSIS — Z720 Tobacco use: Secondary | ICD-10-CM | POA: Diagnosis not present

## 2018-02-10 DIAGNOSIS — R3129 Other microscopic hematuria: Secondary | ICD-10-CM | POA: Diagnosis not present

## 2018-02-10 DIAGNOSIS — J440 Chronic obstructive pulmonary disease with acute lower respiratory infection: Secondary | ICD-10-CM | POA: Diagnosis not present

## 2018-02-10 DIAGNOSIS — J209 Acute bronchitis, unspecified: Secondary | ICD-10-CM

## 2018-02-10 DIAGNOSIS — F320 Major depressive disorder, single episode, mild: Secondary | ICD-10-CM | POA: Diagnosis not present

## 2018-02-10 DIAGNOSIS — E559 Vitamin D deficiency, unspecified: Secondary | ICD-10-CM

## 2018-02-10 DIAGNOSIS — G4700 Insomnia, unspecified: Secondary | ICD-10-CM

## 2018-02-10 MED ORDER — AZITHROMYCIN 250 MG PO TABS: ORAL_TABLET | ORAL | 0 refills | Status: DC

## 2018-02-10 MED ORDER — VITAMIN D (ERGOCALCIFEROL) 1.25 MG (50000 UNIT) PO CAPS
50000.0000 [IU] | ORAL_CAPSULE | ORAL | 0 refills | Status: DC
Start: 2018-02-10 — End: 2018-04-11

## 2018-02-10 MED ORDER — IPRATROPIUM BROMIDE 0.02 % IN SOLN
0.5000 mg | Freq: Once | RESPIRATORY_TRACT | Status: AC
  Administered 2018-02-10: 0.5 mg via RESPIRATORY_TRACT

## 2018-02-10 MED ORDER — TRAZODONE HCL 50 MG PO TABS: 25.0000 mg | ORAL_TABLET | Freq: Every evening | ORAL | 3 refills | Status: DC | PRN

## 2018-02-10 MED ORDER — BUPROPION HCL ER (XL) 150 MG PO TB24: 150.0000 mg | ORAL_TABLET | Freq: Every day | ORAL | 0 refills | Status: DC

## 2018-02-10 MED ORDER — ALBUTEROL SULFATE (2.5 MG/3ML) 0.083% IN NEBU
2.5000 mg | INHALATION_SOLUTION | Freq: Once | RESPIRATORY_TRACT | Status: AC
  Administered 2018-02-10: 2.5 mg via RESPIRATORY_TRACT

## 2018-02-10 NOTE — Progress Notes (Signed)
Patient ID: Suzanne Morton, female    DOB: 1960/12/19, 57 y.o.   MRN: [id number]  Chief Complaint  Patient presents with  . Establish Care    former Delton See patient    Allergies Ivp dye [iodinated diagnostic agents]  Subjective:   Suzanne Morton is a 57 y.o. female who presents to [facility] today.  HPI Here to establish care. Has previously been seen by Dr. Delton See. Has been under some stress because daughter is in her 23s and recently had a stroke. Reports that since this has happened to her daughter that she has been feeling sad and down. Not her usual self over the past month or so. Has a lot on her now b/c is trying to care of her daughters finances and worries about her health.  Has previously been tried on Lexapro by Dr. Delton See for her mood but reports that she did not do well with the medication.  Reports that she felt like she was in a fog and did not like the way she felt while taking the medication.  Denies any suicidal or homicidal ideations.  Reports that she does have hope that things are going to get better but lives in fear of something happening to her daughter.  Here b/c was seen in the ED at [facility] several weeks ago bc had been coughing for 3 days. Reports that she went to the ED for cough and for feeling winded and like she could not breathe well. Did CXR and was told had mild COPD and URI and treated with prednisone and albuterol inhaler. Had been feeling bad 3 days when went to the ED.  Now, is feeling a bit better, but has been over 2 weeks and cannot get rid of congestion. Is not coughing as much but is still brining up mucus and sputum and feels tightness in chest like she is wheezing. Prior to this episode preceding the ED visit, had not previously had daily sputum production or any SOB.  Has had pneumonia in the past, CAP, years ago. No prior pneumonia vaccines.   Does smoke 1 pack of cigarettes a day for over 30 years.  Has been cutting down on her  cigarette use over the past month.  Now is smoking about 4 to 5 cigarettes a day would like to quit smoking.  Feels now like she needs to be there for her family and does not want anything to happen to her so she is motivated to quit smoking.  Has also been concerning to her since she was given the diagnosis of COPD. She denies any chest pain, palpitations, swelling in her extremities.  Denies any numbness or tingling in her extremities.  Has never had a heart attack or stroke.  Does reportedly have a history of hypertension and is on medication for several years.   Past Medical History:  Diagnosis Date  . Abnormal Pap smear   . Allergy   . Anemia   . Anxiety   . Epistaxis   . GERD (gastroesophageal reflux disease)   . HSV-2 (herpes simplex virus 2) infection   . Hyperlipidemia   . Hypertension     Past Surgical History:  Procedure Laterality Date  .  funduplication    . CHOLECYSTECTOMY     Dr. Leona Carry  . COLONOSCOPY N/A 10/23/2012   Dr. Jena Gauss- normal rectum (anal papilla), colon and terminal ileum  . ESOPHAGOGASTRODUODENOSCOPY N/A 05/05/2015   Dr. Jena Gauss: erosive reflux esophagitis, multiple antral ulcers/erosions,  prior fundoplication intact. Negative H.pylori   . ESOPHAGOGASTRODUODENOSCOPY N/A 08/13/2015   Procedure: ESOPHAGOGASTRODUODENOSCOPY (EGD);  Surgeon: Corbin Ade, MD;  Location: [facility];  Service: Endoscopy;  Laterality: N/A;  230   . TUBAL LIGATION      Family History  Problem Relation Age of Onset  . Lung cancer Mother   . Alcohol abuse Mother   . Cancer Mother        lung  . COPD Mother   . Alcoholism Father   . Alcohol abuse Father   . Early death Father 97       murder  . Cancer Sister        cervical  . Depression Brother   . Colon cancer Neg Hx      Social History   Socioeconomic History  . Marital status: Divorced    Spouse name: Not on file  . Number of children: 2  . Years of education: 53  . Highest education level: Not on file    Occupational History  . Occupation: Licensed conveyancer: [employer]  Social Needs  . Financial resource strain: Not on file  . Food insecurity:    Worry: Not on file    Inability: Not on file  . Transportation needs:    Medical: Not on file    Non-medical: Not on file  Tobacco Use  . Smoking status: Current Every Day Smoker    Packs/day: 0.50    Years: 30.00    Pack years: 15.00    Types: Cigarettes    Start date: 07/19/1978  . Smokeless tobacco: Never Used  . Tobacco comment: smokes 4 - 5 cigarettes  Substance and Sexual Activity  . Alcohol use: No  . Drug use: No  . Sexual activity: Yes  Lifestyle  . Physical activity:    Days per week: Not on file    Minutes per session: Not on file  . Stress: Not on file  Relationships  . Social connections:    Talks on phone: Not on file    Gets together: Not on file    Attends religious service: Not on file    Active member of club or organization: Not on file    Attends meetings of clubs or organizations: Not on file    Relationship status: Not on file  Other Topics Concern  . Not on file  Social History Narrative   Lives with brother   He is on disability.    Smokes.   Has a daughter that works as a Engineer, civil (consulting) for [employer].      Current Outpatient Medications on File Prior to Visit  Medication Sig Dispense Refill  . albuterol (PROVENTIL HFA;VENTOLIN HFA) 108 (90 Base) MCG/ACT inhaler Inhale 2 puffs into the lungs every 6 (six) hours as needed for wheezing or shortness of breath. 1 Inhaler 0  . B Complex Vitamins (VITAMIN-B COMPLEX PO) Take 1 tablet by mouth daily.    . Cholecalciferol (VITAMIN D PO) Take 1 tablet by mouth daily.    Marland Kitchen linaclotide (LINZESS) 145 MCG CAPS capsule Take 1 capsule (145 mcg total) by mouth daily as needed. 90 capsule 1  . metoprolol tartrate (LOPRESSOR) 25 MG tablet Take 1 tablet (25 mg total) by mouth 2 (two) times daily. 180 tablet 1  . pantoprazole (PROTONIX) 40 MG tablet Take 1  tablet (40 mg total) by mouth 2 (two) times daily. 180 tablet 3  . rosuvastatin (CRESTOR) 5 MG tablet Take  1 tablet (5 mg total) by mouth daily. 90 tablet 1   No current facility-administered medications on file prior to visit.     Review of Systems  Constitutional: Negative for activity change, appetite change, chills, diaphoresis, fatigue and fever.  HENT: Negative for congestion, dental problem, postnasal drip, sinus pressure, sneezing, sore throat, tinnitus, trouble swallowing and voice change.   Eyes: Negative for visual disturbance.  Respiratory: Positive for cough, chest tightness and wheezing. Negative for shortness of breath.   Cardiovascular: Negative for chest pain, palpitations and leg swelling.  Gastrointestinal: Negative for abdominal pain, nausea and vomiting.  Genitourinary: Negative for decreased urine volume, difficulty urinating, dysuria, flank pain, frequency, hematuria, pelvic pain, urgency, vaginal bleeding, vaginal discharge and vaginal pain.  Musculoskeletal: Negative for back pain and neck pain.  Neurological: Negative for dizziness, tremors, syncope, weakness, light-headedness, numbness and headaches.  Hematological: Negative for adenopathy.  Psychiatric/Behavioral: Positive for decreased concentration, dysphoric mood and sleep disturbance. Negative for agitation, behavioral problems and suicidal ideas. The patient is nervous/anxious.      Objective:   BP 128/78 (BP Location: Right Arm, Patient Position: Sitting, Cuff Size: Normal)   Pulse 90   Temp 98.4 F (36.9 C) (Temporal)   Resp 14   Ht 5\' 4"  (1.626 m)   Wt 119 lb 1.9 oz (54 kg)   SpO2 97%   BMI 20.45 kg/m   Physical Exam  Constitutional: She is oriented to person, place, and time. She appears well-developed and well-nourished.  HENT:  Head: Normocephalic.  Mouth/Throat: Oropharynx is clear and moist.  Eyes: Pupils are equal, round, and reactive to light. Conjunctivae and EOM are normal. No  scleral icterus.  Neck: Normal range of motion. Neck supple. No JVD present. No tracheal deviation present. No thyromegaly present.  Cardiovascular: Normal rate, regular rhythm and normal heart sounds.  No murmur heard. Pulmonary/Chest: Effort normal. She has wheezes.  Breath sounds clear bilaterally but with  end expiratory wheezes.  Status post albuterol/ipratropium nebulizer therapy wheezing cleared with clear breath sounds bilaterally.  No rhonchi or crackles present.  Musculoskeletal: Normal range of motion.  Lymphadenopathy:    She has no cervical adenopathy.  Neurological: She is alert and oriented to person, place, and time. No cranial nerve deficit.  Skin: Skin is warm and dry. Capillary refill takes less than 2 seconds. No rash noted.  Psychiatric: Her behavior is normal. Judgment and thought content normal.    Depression screen [facility] 2/9 02/10/2018 10/31/2017 03/30/2017  Decreased Interest 2 0 0  Down, Depressed, Hopeless 0 0 0  PHQ - 2 Score 2 0 0  Altered sleeping 1 - -  Tired, decreased energy 1 - -  Change in appetite 0 - -  Feeling bad or failure about yourself  1 - -  Trouble concentrating 1 - -  Moving slowly or fidgety/restless 0 - -  Suicidal thoughts 0 - -  PHQ-9 Score 6 - -  Difficult doing work/chores Not difficult at all - -   Chest x-ray report reviewed dated 01/22/2018 from [facility] ED: Stable hyperinflated lungs compatible with COPD and aortic atherosclerosis.  No active disease noted. Assessment and Plan  1. Acute bronchitis with COPD (HCC) Discussion today with patient regarding diagnosis of COPD and her current symptoms.  I believe that patient has bronchitis at this time which is exacerbating her underlying COPD.  She has been treated with steroids and had improvement of her symptoms but is still has sputum production and wheezing.  Upon questioning,  she has not been using her inhaler in the correct fashion.  She was  given demo and instructions today regarding  correct use of metered-dose inhaler.  She defers steroid treatment again but if she is not getting better or if getting worse she will call our office.  We will start antibiotics at this time.  She will use her albuterol every 4-6 hours as needed.  She was counseled concerning worrisome signs and symptoms of breathing dysfunction if those occur she will go to the urgent care or the emergency department.  We will request records from emergency department visit at this time. She agrees to follow-up in 2 to 4 weeks for recheck.  We will consider baseline pulmonary function studies once she is improved.  If she is not improving will obtain chest x-ray.  Chest x-ray from emergency department visit dated 01/22/2018 reviewed today. - azithromycin (ZITHROMAX Z-PAK) 250 MG tablet; Take two pills today and one pill each day afterwards for the next four days  Dispense: 6 each; Refill: 0 - albuterol (PROVENTIL) (2.5 MG/3ML) 0.083% nebulizer solution 2.5 mg - ipratropium (ATROVENT) nebulizer solution 0.5 mg  2. Depression, major, single episode, mild (HCC) Patient with symptoms of depression secondary to recent stressful life experience.  She also desires to quit smoking.  Has had previous negative side effects with Lexapro.  Will treat with Wellbutrin at this time.  Counseled regarding worrisome signs and symptoms of depression and if those occur contact medical help.  We discussed risk versus benefits of medication and specifically Wellbutrin.  She has no history of seizure disorder.  She will follow-up in 2 to 4 weeks we will recheck blood pressure and assess symptoms at that time.  She defers therapy at this time.  Suicide risks evaluated and documented in note if present or in the area below.  Patient does not have/denies the following risks: previous suicide attempts, family history of suicide, access to lethal means, prior history of psychiatric disorder, history of alcohol or substance abuse disorder, recent  loss of a loved one, or severe hopelessness. Patient denies access to firearms or if present will have removed from home/access.   Patient specifically denies suicide ideation. Patient has access/information to healthcare contacts if situation or mood changes where patient is a risk to self or others or mood becomes unstable.   During the encounter, the patient had good eye contact and firm handshake regarding safety contract and agreement to seek help if mood worsens and not to harm self.   Patient understands the treatment plan and is in agreement. Agrees to keep follow up and call prior or return to clinic if needed.   - buPROPion (WELLBUTRIN XL) 150 MG 24 hr tablet; Take 1 tablet (150 mg total) by mouth daily.  Dispense: 30 tablet; Refill: 0  3. Microscopic hematuria Review of labs from April 2019 reveal hematuria, microscopic.  Patient currently asymptomatic in regards to signs and symptoms of UTI.  Will repeat urinalysis at this time. - Urinalysis, Routine w reflex microscopic  4. Tobacco abuse The 5 A's Model for treating Tobacco Use and Dependence was used today. I have identified and documented tobacco use status for this patient. I have urged the patient to quit tobacco use. At this time, the patient is willing and ready to attempt to quit. We have discussed medication options to aid in smoking cessation including nicotine replacement therapy (NRT), zyban, and chantix. We have also discussed behavioral modifications, lifestyle changes, and patient support options to  aid in tobacco cessation success. Patient was congratulated on their desire to make this positive change for their health. Patient instructed to keep their follow up to ensure success.  We will try Wellbutrin for her mood and to aid with tobacco cessation.  She will choose a quit date within the next several weeks and she will call back to let us know.  We discussed tobacco cessation today and support to aid in quitting smoking.   She understands the health implications regarding tobacco use. - CT CHEST LUNG CA SCREEN LOW DOSE W/O CM; Future She wishes to proceed with CT scan of chest for lung cancer.  She will follow-up in the office for these results.  Screening test was discussed with her in detail. 5. Vitamin D deficiency Patient with vitamin D deficiency.  Plan to recheck in 3 months. - Vitamin D, Ergocalciferol, (DRISDOL) 50000 units CAPS capsule; Take 1 capsule (50,000 Units total) by mouth every 7 (seven) days.  Dispense: 12 capsule; Refill: 0  6. Insomnia, unspecified type Sleep hygiene discussed with patient.  Relaxation techniques discussed.  Exercise recommended once COPD exacerbation resolved.  Risk versus benefits of medication discussed. - traZODone (DESYREL) 50 MG tablet; Take 0.5-1 tablets (25-50 mg total) by mouth at bedtime as needed for sleep.  Dispense: 30 tablet; Refill: 3  Return in about 2 weeks (around 02/24/2018) for mood/cough. Aliene Beamsachel Donovon Micheletti, MD 02/10/2018

## 2018-02-14 LAB — URINALYSIS, ROUTINE W REFLEX MICROSCOPIC
BACTERIA UA: NONE SEEN /HPF
Bilirubin Urine: NEGATIVE
Glucose, UA: NEGATIVE
Hyaline Cast: NONE SEEN /LPF
KETONES UR: NEGATIVE
Nitrite: NEGATIVE
Protein, ur: NEGATIVE
Specific Gravity, Urine: 1.027 (ref 1.001–1.03)
pH: <=5 (ref 5.0–8.0)

## 2018-02-14 LAB — URINE CULTURE

## 2018-02-15 ENCOUNTER — Telehealth: Payer: Self-pay

## 2018-02-15 DIAGNOSIS — R3129 Other microscopic hematuria: Secondary | ICD-10-CM

## 2018-02-15 NOTE — Telephone Encounter (Signed)
Patient notified she needs she needs to leave a urine sample at the lab for culture. Order entered.

## 2018-03-06 ENCOUNTER — Ambulatory Visit: Payer: 59

## 2018-03-06 DIAGNOSIS — R3129 Other microscopic hematuria: Secondary | ICD-10-CM

## 2018-03-06 NOTE — Progress Notes (Signed)
Patient came in today and left a urine sample to be sent to lab for culture.  Sample collected, processed, and walked to lab.   Lab couldn't culture previous sample sent in.

## 2018-03-07 LAB — URINE CULTURE
MICRO NUMBER: 90985958
SPECIMEN QUALITY:: ADEQUATE

## 2018-03-08 ENCOUNTER — Other Ambulatory Visit: Payer: Self-pay

## 2018-03-08 ENCOUNTER — Ambulatory Visit: Payer: 59 | Admitting: Family Medicine

## 2018-03-08 ENCOUNTER — Telehealth: Payer: Self-pay | Admitting: Family Medicine

## 2018-03-08 ENCOUNTER — Encounter: Payer: Self-pay | Admitting: Family Medicine

## 2018-03-08 VITALS — BP 126/86 | HR 83 | Temp 98.2°F | Resp 12 | Ht 64.0 in | Wt 120.0 lb

## 2018-03-08 DIAGNOSIS — J209 Acute bronchitis, unspecified: Secondary | ICD-10-CM

## 2018-03-08 DIAGNOSIS — J440 Chronic obstructive pulmonary disease with acute lower respiratory infection: Secondary | ICD-10-CM

## 2018-03-08 DIAGNOSIS — Z720 Tobacco use: Secondary | ICD-10-CM | POA: Diagnosis not present

## 2018-03-08 DIAGNOSIS — Z23 Encounter for immunization: Secondary | ICD-10-CM | POA: Diagnosis not present

## 2018-03-08 DIAGNOSIS — R3129 Other microscopic hematuria: Secondary | ICD-10-CM | POA: Diagnosis not present

## 2018-03-08 DIAGNOSIS — E785 Hyperlipidemia, unspecified: Secondary | ICD-10-CM | POA: Diagnosis not present

## 2018-03-08 NOTE — Telephone Encounter (Signed)
New Message  Pt verbalized for [name] to call her back

## 2018-03-08 NOTE — Patient Instructions (Addendum)
Please schedule for CT scan chest/low dose/screening lung cancer  You will call me in two weeks and we will increase your wellbutrin xl before you come back. You need to give me your quit date when you call.  Steps to Quit Smoking Smoking tobacco can be bad for your health. It can also affect almost every organ in your body. Smoking puts you and people around you at risk for many serious long-lasting (chronic) diseases. Quitting smoking is hard, but it is one of the best things that you can do for your health. It is never too late to quit. What are the benefits of quitting smoking? When you quit smoking, you lower your risk for getting serious diseases and conditions. They can include:  Lung cancer or lung disease.  Heart disease.  Stroke.  Heart attack.  Not being able to have children (infertility).  Weak bones (osteoporosis) and broken bones (fractures).  If you have coughing, wheezing, and shortness of breath, those symptoms may get better when you quit. You may also get sick less often. If you are pregnant, quitting smoking can help to lower your chances of having a baby of low birth weight. What can I do to help me quit smoking? Talk with your doctor about what can help you quit smoking. Some things you can do (strategies) include:  Quitting smoking totally, instead of slowly cutting back how much you smoke over a period of time.  Going to in-person counseling. You are more likely to quit if you go to many counseling sessions.  Using resources and support systems, such as: ? Agricultural engineernline chats with a Veterinary surgeoncounselor. ? Phone quitlines. ? Automotive engineerrinted self-help materials. ? Support groups or group counseling. ? Text messaging programs. ? Mobile phone apps or applications.  Taking medicines. Some of these medicines may have nicotine in them. If you are pregnant or breastfeeding, do not take any medicines to quit smoking unless your doctor says it is okay. Talk with your doctor about counseling  or other things that can help you.  Talk with your doctor about using more than one strategy at the same time, such as taking medicines while you are also going to in-person counseling. This can help make quitting easier. What things can I do to make it easier to quit? Quitting smoking might feel very hard at first, but there is a lot that you can do to make it easier. Take these steps:  Talk to your family and friends. Ask them to support and encourage you.  Call phone quitlines, reach out to support groups, or work with a Veterinary surgeoncounselor.  Ask people who smoke to not smoke around you.  Avoid places that make you want (trigger) to smoke, such as: ? Bars. ? Parties. ? Smoke-break areas at work.  Spend time with people who do not smoke.  Lower the stress in your life. Stress can make you want to smoke. Try these things to help your stress: ? Getting regular exercise. ? Deep-breathing exercises. ? Yoga. ? Meditating. ? Doing a body scan. To do this, close your eyes, focus on one area of your body at a time from head to toe, and notice which parts of your body are tense. Try to relax the muscles in those areas.  Download or buy apps on your mobile phone or tablet that can help you stick to your quit plan. There are many free apps, such as QuitGuide from the [employer] developer(Centers for Disease Control and Prevention). You can find more  support from smokefree.gov and other websites.  This information is not intended to replace advice given to you by your health care provider. Make sure you discuss any questions you have with your health care provider. Document Released: 05/01/2009 Document Revised: 03/02/2016 Document Reviewed: 11/19/2014 Elsevier Interactive Patient Education  2018 Elsevier Inc.  Calcium with magnesium 600 mg twice a day

## 2018-03-08 NOTE — Telephone Encounter (Signed)
Attempted to return patient's call. No answer. Line busy. Will try again later.

## 2018-03-08 NOTE — Progress Notes (Signed)
Patient ID: Suzanne Morton, female    DOB: 06-25-1961, 57 y.o.   MRN: [id number]  Chief Complaint  Patient presents with  . acute bronchitis with copd    follow up    Allergies Ivp dye [iodinated diagnostic agents]  Subjective:   Suzanne Morton is a 57 y.o. female who [facility].  HPI Suzanne Morton presents today for follow-up.  Suzanne Morton reports that Suzanne Morton completed Suzanne Morton antibiotics and Suzanne Morton has been feeling so much better.  Suzanne Morton reports that Suzanne Morton cough is almost gone.  Suzanne Morton does not feel short of breath.  Suzanne Morton is not getting dyspnea on exertion.  Suzanne Morton denies any postnasal drip.  No hemoptysis.  Suzanne Morton reports that Suzanne Morton did not get a call regarding setting up the CT scan for lung cancer screening.  Suzanne Morton is still smoking about a pack a day to 1/2 pack/day.  Suzanne Morton has smoked for over 30 years.  Suzanne Morton denies any change in Suzanne Morton appetite.  No weight loss.  Suzanne Morton is very pleased that Suzanne Morton breathing is back to normal.  Suzanne Morton does not get short of breath with activity.  Suzanne Morton reports that Suzanne Morton did not start the Wellbutrin yet because Suzanne Morton wanted to discuss whether it was okay to take this medication in the evening.  Suzanne Morton reports Suzanne Morton is worried about getting side effects from medications and would rather take the medicine at night rather than in the morning.  Suzanne Morton feels Suzanne Morton takes it at night Suzanne Morton will most likely sleep through the side effects.  Suzanne Morton has never been on any medications to assist Suzanne Morton with quitting smoking.  Suzanne Morton reports Suzanne Morton is motivated to quit and would like to try the medication.  Suzanne Morton also reports that it should most likely help with Suzanne Morton mood.  Suzanne Morton feels like Suzanne Morton is dealing with stress and does not feel as down as Suzanne Morton did last time.  Suzanne Morton daughter is still recovering from a stroke and having some hardship related to that. Suzanne Morton also reports that Suzanne Morton left a repeat urine specimen yesterday at the lab.  At Suzanne Morton last visit when Suzanne Morton was evaluated it was noted that Suzanne Morton had blood in Suzanne Morton urine.  The urine  culture was inadvertently misplaced and not sent properly to the lab.  Suzanne Morton sent in a urine specimen for culture yesterday.  Suzanne Morton denies any dysuria.  Denies any low back pain.  Has not had any gross hematuria.  Denies any vaginal discharge.  Suzanne Morton has not had a menses in quite some time.   Past Medical History:  Diagnosis Date  . Abnormal Pap smear   . Allergy   . Anemia   . Anxiety   . Epistaxis   . GERD (gastroesophageal reflux disease)   . HSV-2 (herpes simplex virus 2) infection   . Hyperlipidemia   . Hypertension     Past Surgical History:  Procedure Laterality Date  .  funduplication    . CHOLECYSTECTOMY     Dr. Leona Carryestefano  . COLONOSCOPY N/A 10/23/2012   Dr. Jena Gaussourk- normal rectum (anal papilla), colon and terminal ileum  . ESOPHAGOGASTRODUODENOSCOPY N/A 05/05/2015   Dr. Jena Gaussourk: erosive reflux esophagitis, multiple antral ulcers/erosions, prior fundoplication intact. Negative H.pylori   . ESOPHAGOGASTRODUODENOSCOPY N/A 08/13/2015   Procedure: ESOPHAGOGASTRODUODENOSCOPY (EGD);  Surgeon: Corbin Adeobert M Rourk, MD;  Location: [facility];  Service: Endoscopy;  Laterality: N/A;  230   . TUBAL LIGATION      Family History  Problem Relation Age of  Onset  . Lung cancer Mother   . Alcohol abuse Mother   . Cancer Mother        lung  . COPD Mother   . Alcoholism Father   . Alcohol abuse Father   . Early death Father 62       murder  . Cancer Sister        cervical  . Depression Brother   . Colon cancer Neg Hx      Social History   Socioeconomic History  . Marital status: Divorced    Spouse name: Not on file  . Number of children: 2  . Years of education: 65  . Highest education level: Not on file  Occupational History  . Occupation: Licensed conveyancer: [employer]  Social Needs  . Financial resource strain: Not on file  . Food insecurity:    Worry: Not on file    Inability: Not on file  . Transportation needs:    Medical: Not on file    Non-medical:  Not on file  Tobacco Use  . Smoking status: Current Every Day Smoker    Packs/day: 0.50    Years: 30.00    Pack years: 15.00    Types: Cigarettes    Start date: 07/19/1978  . Smokeless tobacco: Never Used  . Tobacco comment: smokes 4 - 5 cigarettes  Substance and Sexual Activity  . Alcohol use: No  . Drug use: No  . Sexual activity: Yes  Lifestyle  . Physical activity:    Days per week: Not on file    Minutes per session: Not on file  . Stress: Not on file  Relationships  . Social connections:    Talks on phone: Not on file    Gets together: Not on file    Attends religious service: Not on file    Active member of club or organization: Not on file    Attends meetings of clubs or organizations: Not on file    Relationship status: Not on file  Other Topics Concern  . Not on file  Social History Narrative   Lives with brother   He is on disability.    Smokes.   Has a daughter that works as a Engineer, civil (consulting) for [employer].     Review of Systems  Constitutional: Negative for activity change, appetite change, fatigue and fever.  HENT: Negative for trouble swallowing.   Eyes: Negative for visual disturbance.  Respiratory: Negative for cough, chest tightness and shortness of breath.   Cardiovascular: Negative for chest pain, palpitations and leg swelling.  Gastrointestinal: Negative for abdominal pain, constipation, nausea and vomiting.  Endocrine: Negative for polyphagia and polyuria.  Genitourinary: Negative for difficulty urinating, dysuria, flank pain, frequency, hematuria, menstrual problem, urgency, vaginal bleeding, vaginal discharge and vaginal pain.  Musculoskeletal: Negative for neck pain.  Neurological: Negative for dizziness, tremors, syncope, light-headedness and headaches.  Hematological: Negative for adenopathy.  Psychiatric/Behavioral: Negative for dysphoric mood. The patient is not nervous/anxious and is not hyperactive.      Objective:   BP 126/86 (BP Location: Left  Arm, Patient Position: Sitting, Cuff Size: Normal)   Pulse 83   Temp 98.2 F (36.8 C) (Temporal)   Resp 12   Ht 5\' 4"  (1.626 m)   Wt 120 lb 0.6 oz (54.4 kg)   SpO2 96% Comment: room air  BMI 20.60 kg/m   Physical Exam  Constitutional: Suzanne Morton is oriented to person, place, and time. Suzanne Morton appears well-developed and  well-nourished. No distress.  HENT:  Head: Normocephalic and atraumatic.  Eyes: Pupils are equal, round, and reactive to light.  Neck: Normal range of motion. Neck supple. No thyromegaly present.  Cardiovascular: Normal rate, regular rhythm and normal heart sounds.  Pulmonary/Chest: Effort normal and breath sounds normal. No respiratory distress.  Neurological: Suzanne Morton is alert and oriented to person, place, and time. No cranial nerve deficit.  Skin: Skin is warm and dry.  Psychiatric: Suzanne Morton has a normal mood and affect. Suzanne Morton behavior is normal. Judgment and thought content normal.  Nursing note and vitals reviewed.  Dep[facility] 03/08/2018 02/10/2018 10/31/2017 03/30/2017  Decreased Interest 0 2 0 0  Down, Depressed, Hopeless 0 0 0 0  PHQ - 2 Score 0 2 0 0  Altered sleeping 1 1 - -  Tired, decreased energy 1 1 - -  Change in appetite 0 0 - -  Feeling bad or failure about yourself  0 1 - -  Trouble concentrating 1 1 - -  Moving slowly or fidgety/restless 0 0 - -  Suicidal thoughts 0 0 - -  PHQ-9 Score 3 6 - -  Difficult doing work/chores Not difficult at all Not difficult at all - -     Assessment and Plan  1. Tobacco use The 5 A's Model for treating Tobacco Use and Dependence was used today. I have identified and documented tobacco use status for this patient. I have urged the patient to quit tobacco use. At this time, the patient is willing and ready to attempt to quit. We have discussed medication options to aid in smoking cessation including nicotine replacement therapy (NRT), zyban, and chantix. We have also discussed behavioral modifications, lifestyle changes,  and patient support options to aid in tobacco cessation success. Patient was congratulated on their desire to make this positive change for their health. Patient instructed to keep their follow up to ensure success.  Patient does have the Wellbutrin XL at home.  Suzanne Morton plans to start the medication.  Suzanne Morton will call me in 2 weeks.  If Suzanne Morton is taking the 150 mg a day as directed and not having any side effects, at that time we will increase to 300 mg a day.  We discussed the risk versus benefits of this medication.  Suzanne Morton has no known seizure disorder.  Suzanne Morton does wish to try this medication at night rather than in the morning.  We did discuss that it could cause some sleep disturbance.  Suzanne Morton would like to try it at night and I do not see why this would be a problem. - CT CHEST LUNG CA SCREEN LOW DOSE W/O CM; Future - Pneumococcal conjugate vaccine 13-valent IM  2. Microscopic hematuria Will await repeat urinalysis/urine culture.  Did discuss with patient I would like for Suzanne Morton to go ahead and follow-up with urology due to the chronicity of the microscopic hematuria.  We discussed that Suzanne Morton would need evaluation by urology due to the fact that this is been persistent and Suzanne Morton does have a history of tobacco use.  Referral will be placed today.  3. Hyperlipidemia, unspecified hyperlipidemia type Hyperlipidemia and the associated risk of ASCVD were discussed today. Primary vs. Secondary prevention of ASCVD were discussed and how it relates to patient morbidity, mortality, and quality of life. Shared decision making with patient including the risks of statins vs.benefits of ASCVD risk reduction discussed.  Risks of stains discussed including myopathy, rhabdomyoloysis, liver problems, increased risk of diabetes discussed. We discussed heart  healthy diet, lifestyle modifications, risk factor modifications, and adherence to the recommended treatment plan. We discussed the need to periodically monitor lipid panel and liver  function tests while on statin therapy.   We did discuss Suzanne Morton diagnosis of COPD in relation to bronchitis and tobacco abuse.  Suzanne Morton symptoms have improved and Suzanne Morton cough is resolved.  Consider PFTs and will discuss in further detail at next visit.  Patient will follow-up in 2 to 4 weeks or sooner if needed and call with any questions or concerns. Today the office visit today was greater than 25 minutes.  Greater than 50% of office visit spent counseling and coordinating care Return in about 4 weeks (around 04/05/2018) for follow up . Aliene Beams, MD 03/08/2018

## 2018-03-09 NOTE — Telephone Encounter (Signed)
Advised patient of recent UA results and Dr.Haglers recommendation she see a urologist for blood in her urine. She verbalized understanding.   No infection. Needs to see urologist due to blood in urine for some time, per Dr.Hagler

## 2018-03-09 NOTE — Telephone Encounter (Signed)
Spoke with patient and she is wanting recent lab results. Advised her I will check and call her back with verbal understanding.

## 2018-03-23 ENCOUNTER — Encounter: Payer: Self-pay | Admitting: Family Medicine

## 2018-04-05 ENCOUNTER — Encounter: Payer: Self-pay | Admitting: Family Medicine

## 2018-04-05 ENCOUNTER — Ambulatory Visit: Payer: 59 | Admitting: Family Medicine

## 2018-04-05 VITALS — BP 138/80 | HR 72 | Resp 15 | Ht 64.0 in | Wt 120.0 lb

## 2018-04-05 DIAGNOSIS — Z23 Encounter for immunization: Secondary | ICD-10-CM | POA: Diagnosis not present

## 2018-04-05 DIAGNOSIS — K2100 Gastro-esophageal reflux disease with esophagitis, without bleeding: Secondary | ICD-10-CM

## 2018-04-05 DIAGNOSIS — E782 Mixed hyperlipidemia: Secondary | ICD-10-CM | POA: Diagnosis not present

## 2018-04-05 DIAGNOSIS — G4700 Insomnia, unspecified: Secondary | ICD-10-CM

## 2018-04-05 DIAGNOSIS — I10 Essential (primary) hypertension: Secondary | ICD-10-CM | POA: Diagnosis not present

## 2018-04-05 DIAGNOSIS — Z720 Tobacco use: Secondary | ICD-10-CM

## 2018-04-05 DIAGNOSIS — F320 Major depressive disorder, single episode, mild: Secondary | ICD-10-CM | POA: Diagnosis not present

## 2018-04-05 MED ORDER — BUPROPION HCL ER (XL) 300 MG PO TB24: 300.0000 mg | ORAL_TABLET | Freq: Every day | ORAL | 2 refills | Status: DC

## 2018-04-05 MED ORDER — PANTOPRAZOLE SODIUM 40 MG PO TBEC: 40.0000 mg | DELAYED_RELEASE_TABLET | Freq: Two times a day (BID) | ORAL | 0 refills | Status: DC

## 2018-04-05 MED ORDER — ROSUVASTATIN CALCIUM 5 MG PO TABS: 5.0000 mg | ORAL_TABLET | Freq: Every day | ORAL | 1 refills | Status: AC

## 2018-04-05 MED ORDER — TRAZODONE HCL 50 MG PO TABS: 25.0000 mg | ORAL_TABLET | Freq: Every evening | ORAL | 1 refills | Status: DC | PRN

## 2018-04-05 MED ORDER — METOPROLOL TARTRATE 25 MG PO TABS: 25.0000 mg | ORAL_TABLET | Freq: Two times a day (BID) | ORAL | 1 refills | Status: DC

## 2018-04-05 NOTE — Progress Notes (Signed)
Patient ID: Suzanne Morton, female    DOB: 10/03/1960, 57 y.o.   MRN: [id number]  Chief Complaint  Patient presents with  . Hypertension    follow up visit     Allergies Ivp dye [iodinated diagnostic agents]  Subjective:   Suzanne Morton is a 57 y.o. female who presents to [facility] today.  HPI Here for follow up. Has been doing well and has cut down on cigarette smoking.  Is happy with her improvement.  Is now on down to about 5 cigarettes a day.  Reports this is a drastic increase from her previous level of smoking.  Is tolerating the Wellbutrin without problems.  Does not feel she has had an increase in her anxiety.  Her mood is good.  Is dealing with the stress of her daughter getting ready to have a brain surgery.  Denies alcohol or drug use.  No prior history of seizures.  Would like to go to the higher dose of Wellbutrin.  Reports that she is committed to quitting smoking. Does need a refill on her blood pressure medication.  Blood pressures been well controlled.  No side effects with medication.  Taking the metoprolol twice a day. Has not gotten a CT scan for lung cancer screening.  Reports that she was not called regarding this imaging study.  The order is been placed twice.  She denies any cough.  No shortness of breath. Needs to get a refill on her reflux medication.  Is taking the Protonix twice a day as directed by Dr. Kendell Bane.  Has a follow-up with him in December but needs medicines prior to that time. He is taking the trazodone at night to help with sleep.  Reports she still does have some anxiety and this helps her to rest.  Denies any suicidal or homicidal ideation.  Appetite is good.  Mood is good.  Energy level is good.  Job is going well.  Is not getting much exercise.  Is still taking her cholesterol medication each night.  No myalgias or side effects.  Is not due for q. six-month liver test yet.  Last FLP in April 2019 was at goal.   Past Medical History:    Diagnosis Date  . Abnormal Pap smear   . Allergy   . Anemia   . Anxiety   . Epistaxis   . GERD (gastroesophageal reflux disease)   . HSV-2 (herpes simplex virus 2) infection   . Hyperlipidemia   . Hypertension     Past Surgical History:  Procedure Laterality Date  .  funduplication    . CHOLECYSTECTOMY     Dr. Leona Carry  . COLONOSCOPY N/A 10/23/2012   Dr. Jena Gauss- normal rectum (anal papilla), colon and terminal ileum  . ESOPHAGOGASTRODUODENOSCOPY N/A 05/05/2015   Dr. Jena Gauss: erosive reflux esophagitis, multiple antral ulcers/erosions, prior fundoplication intact. Negative H.pylori   . ESOPHAGOGASTRODUODENOSCOPY N/A 08/13/2015   Procedure: ESOPHAGOGASTRODUODENOSCOPY (EGD);  Surgeon: Corbin Ade, MD;  Location: [facility];  Service: Endoscopy;  Laterality: N/A;  230   . TUBAL LIGATION      Family History  Problem Relation Age of Onset  . Lung cancer Mother   . Alcohol abuse Mother   . Cancer Mother        lung  . COPD Mother   . Alcoholism Father   . Alcohol abuse Father   . Early death Father 40       murder  . Cancer Sister  cervical  . Depression Brother   . Colon cancer Neg Hx      Social History   Socioeconomic History  . Marital status: Divorced    Spouse name: Not on file  . Number of children: 2  . Years of education: 77  . Highest education level: Not on file  Occupational History  . Occupation: Licensed conveyancer: [employer]  Social Needs  . Financial resource strain: Not on file  . Food insecurity:    Worry: Not on file    Inability: Not on file  . Transportation needs:    Medical: Not on file    Non-medical: Not on file  Tobacco Use  . Smoking status: Current Every Day Smoker    Packs/day: 0.50    Years: 30.00    Pack years: 15.00    Types: Cigarettes    Start date: 07/19/1978  . Smokeless tobacco: Never Used  . Tobacco comment: smokes 4 - 5 cigarettes  Substance and Sexual Activity  . Alcohol use: No  .  Drug use: No  . Sexual activity: Yes  Lifestyle  . Physical activity:    Days per week: Not on file    Minutes per session: Not on file  . Stress: Not on file  Relationships  . Social connections:    Talks on phone: Not on file    Gets together: Not on file    Attends religious service: Not on file    Active member of club or organization: Not on file    Attends meetings of clubs or organizations: Not on file    Relationship status: Not on file  Other Topics Concern  . Not on file  Social History Narrative   Lives with brother   He is on disability.    Smokes.   Has a daughter that works as a Engineer, civil (consulting) for [employer].     Review of Systems  Constitutional: Negative for activity change, appetite change and fever.  Eyes: Negative for visual disturbance.  Respiratory: Negative for cough, chest tightness and shortness of breath.   Cardiovascular: Negative for chest pain, palpitations and leg swelling.  Gastrointestinal: Negative for abdominal pain, nausea and vomiting.  Genitourinary: Negative for dysuria, frequency and urgency.  Musculoskeletal: Negative for back pain and myalgias.  Skin: Negative for rash.  Neurological: Negative for dizziness, tremors, syncope and light-headedness.  Hematological: Negative for adenopathy. Does not bruise/bleed easily.  Psychiatric/Behavioral: Negative for agitation, decreased concentration, dysphoric mood, self-injury and sleep disturbance. The patient is not nervous/anxious and is not hyperactive.    Current Outpatient Medications on File Prior to Visit  Medication Sig Dispense Refill  . albuterol (PROVENTIL HFA;VENTOLIN HFA) 108 (90 Base) MCG/ACT inhaler Inhale 2 puffs into the lungs every 6 (six) hours as needed for wheezing or shortness of breath. 1 Inhaler 0  . B Complex Vitamins (VITAMIN-B COMPLEX PO) Take 1 tablet by mouth daily.    . Cholecalciferol (VITAMIN D PO) Take 1 tablet by mouth daily.    Marland Kitchen linaclotide (LINZESS) 145 MCG CAPS capsule  Take 1 capsule (145 mcg total) by mouth daily as needed. 90 capsule 1  . Vitamin D, Ergocalciferol, (DRISDOL) 50000 units CAPS capsule Take 1 capsule (50,000 Units total) by mouth every 7 (seven) days. 12 capsule 0   No current facility-administered medications on file prior to visit.      Objective:   BP 138/80   Pulse 72   Resp 15   Ht 5'  4" (1.626 m)   Wt 120 lb (54.4 kg)   SpO2 100%   BMI 20.60 kg/m   Physical Exam  Constitutional: She is oriented to person, place, and time. She appears well-developed and well-nourished.  HENT:  Head: Normocephalic and atraumatic.  Eyes: Pupils are equal, round, and reactive to light. Conjunctivae and EOM are normal.  Neck: Normal range of motion. Neck supple. No thyromegaly present.  Cardiovascular: Normal rate and regular rhythm.  Pulmonary/Chest: Effort normal and breath sounds normal. No respiratory distress.  Neurological: She is alert and oriented to person, place, and time. No cranial nerve deficit.  Skin: Skin is warm and dry. Capillary refill takes less than 2 seconds.  Psychiatric: She has a normal mood and affect. Her behavior is normal. Judgment and thought content normal.   Depression screen [facility] 2/9 04/05/2018 03/08/2018 02/10/2018 10/31/2017 03/30/2017  Decreased Interest 0 0 2 0 0  Down, Depressed, Hopeless 0 0 0 0 0  PHQ - 2 Score 0 0 2 0 0  Altered sleeping 1 1 1  - -  Tired, decreased energy 1 1 1  - -  Change in appetite 0 0 0 - -  Feeling bad or failure about yourself  0 0 1 - -  Trouble concentrating 1 1 1  - -  Moving slowly or fidgety/restless 0 0 0 - -  Suicidal thoughts 0 0 0 - -  PHQ-9 Score 3 3 6  - -  Difficult doing work/chores Not difficult at all Not difficult at all Not difficult at all - -    Assessment and Plan  1. Mixed hyperlipidemia Hyperlipidemia and the associated risk of ASCVD were discussed today. Primary vs. Secondary prevention of ASCVD were discussed and how it relates to patient morbidity,  mortality, and quality of life. Shared decision making with patient including the risks of statins vs.benefits of ASCVD risk reduction discussed.  Risks of stains discussed including myopathy, rhabdomyoloysis, liver problems, increased risk of diabetes discussed. We discussed heart healthy diet, lifestyle modifications, risk factor modifications, and adherence to the recommended treatment plan. We discussed the need to periodically monitor lipid panel and liver function tests while on statin therapy.  Patient counseled in detail regarding the risks of medication. Told to call or return to clinic if develop any worrisome signs or symptoms. Patient voiced understanding.   - rosuvastatin (CRESTOR) 5 MG tablet; Take 1 tablet (5 mg total) by mouth daily.  Dispense: 90 tablet; Refill: 1  2. Depression, major, single episode, mild (HCC) Depression, mild mixed with anxiety.  Continue trazodone as directed. Suicide risks evaluated and documented in note if present or in the area below. Call with any questions or concerns.  Exercise and relaxation recommended. Patient specifically denies suicide ideation. Patient has access/information to healthcare contacts if situation or mood changes where patient is a risk to self or others or mood becomes unstable.   During the encounter, the patient had good eye contact and firm handshake regarding safety contract and agreement to seek help if mood worsens and not to harm self.   Patient understands the treatment plan and is in agreement. Agrees to keep follow up and call prior or return to clinic if needed.   - traZODone (DESYREL) 50 MG tablet; Take 0.5-1 tablets (25-50 mg total) by mouth at bedtime as needed for sleep.  Dispense: 90 tablet; Refill: 1  3. Tobacco use Increase Wellbutrin as directed.  Patient was encouraged to proceed with tobacco cessation.  At this time she wishes  to continue to decrease her smoking rather than to quit abruptly.  She understands the  risks of tobacco use.  Will talk with clinical staff again today regarding scheduling of the CT scan.  Patient was told if she does not hear about the scan by the end of the weight that she needs to call our office.  She voiced understanding. - buPROPion (WELLBUTRIN XL) 300 MG 24 hr tablet; Take 1 tablet (300 mg total) by mouth daily.  Dispense: 30 tablet; Refill: 2  4. Essential hypertension Refill medication. Lifestyle modifications discussed with patient including a diet emphasizing vegetables, fruits, and whole grains. Limiting intake of sodium to less than 2,400 mg per day.  Recommendations discussed include consuming low-fat dairy products, poultry, fish, legumes, non-tropical vegetable oils, and nuts; and limiting intake of sweets, sugar-sweetened beverages, and red meat. Discussed following a plan such as the Dietary Approaches to Stop Hypertension (DASH) diet. Patient to read up on this diet.   - metoprolol tartrate (LOPRESSOR) 25 MG tablet; Take 1 tablet (25 mg total) by mouth 2 (two) times daily.  Dispense: 180 tablet; Refill: 1  5. Insomnia, unspecified type Intermittent, secondary to anxiety.  Continue medications as directed.  Sleep hygiene discussed.  Relaxation and sleep routine discussed and recommended. - traZODone (DESYREL) 50 MG tablet; Take 0.5-1 tablets (25-50 mg total) by mouth at bedtime as needed for sleep.  Dispense: 90 tablet; Refill: 1  6. Reflux esophagitis Follow-up with Dr. Kendell Bane as directed.  Reflux precautions. - pantoprazole (PROTONIX) 40 MG tablet; Take 1 tablet (40 mg total) by mouth 2 (two) times daily.  Dispense: 180 tablet; Refill: 0  7. Need for immunization against influenza Vaccination given today.  Will be due for next pneumonia shot in February 2020. - Flu Vaccine QUAD 36+ mos IM  No follow-ups on file. Suzanne Beams, MD 04/05/2018

## 2018-04-05 NOTE — Patient Instructions (Signed)
Please schedule the CT of chest as ordered.

## 2018-04-10 ENCOUNTER — Telehealth: Payer: Self-pay | Admitting: Family Medicine

## 2018-04-10 NOTE — Telephone Encounter (Signed)
Pt needs Vit D & Ventolin called in --thanks

## 2018-04-11 ENCOUNTER — Other Ambulatory Visit: Payer: Self-pay

## 2018-04-11 DIAGNOSIS — E559 Vitamin D deficiency, unspecified: Secondary | ICD-10-CM

## 2018-04-11 MED ORDER — VITAMIN D (ERGOCALCIFEROL) 1.25 MG (50000 UNIT) PO CAPS: 50000.0000 [IU] | ORAL_CAPSULE | ORAL | 0 refills | Status: DC

## 2018-04-11 MED ORDER — ALBUTEROL SULFATE HFA 108 (90 BASE) MCG/ACT IN AERS: 2.0000 | INHALATION_SPRAY | Freq: Four times a day (QID) | RESPIRATORY_TRACT | 3 refills | Status: AC | PRN

## 2018-04-11 NOTE — Telephone Encounter (Signed)
Prescriptions refilled.

## 2018-04-25 ENCOUNTER — Ambulatory Visit (HOSPITAL_COMMUNITY): Payer: 59

## 2018-05-02 ENCOUNTER — Ambulatory Visit: Payer: 59 | Admitting: Family Medicine

## 2018-05-02 ENCOUNTER — Ambulatory Visit: Payer: 59 | Admitting: Urology

## 2018-05-02 ENCOUNTER — Other Ambulatory Visit (HOSPITAL_COMMUNITY)
Admission: RE | Admit: 2018-05-02 | Discharge: 2018-05-02 | Disposition: A | Discharge status: Home / Self Care | Payer: 59 | Source: Other Acute Inpatient Hospital | Attending: Urology | Admitting: Urology

## 2018-05-02 DIAGNOSIS — R3121 Asymptomatic microscopic hematuria: Secondary | ICD-10-CM | POA: Insufficient documentation

## 2018-05-02 LAB — URINALYSIS, ROUTINE W REFLEX MICROSCOPIC
BACTERIA UA: NONE SEEN
Bilirubin Urine: NEGATIVE
GLUCOSE, UA: NEGATIVE mg/dL
Ketones, ur: NEGATIVE mg/dL
Leukocytes, UA: NEGATIVE
Nitrite: NEGATIVE
PROTEIN: NEGATIVE mg/dL
Specific Gravity, Urine: 1.023 (ref 1.005–1.030)
pH: 5 (ref 5.0–8.0)

## 2018-05-23 ENCOUNTER — Ambulatory Visit (HOSPITAL_COMMUNITY): Admission: RE | Admit: 2018-05-23 | Payer: 59 | Source: Ambulatory Visit

## 2018-08-04 ENCOUNTER — Other Ambulatory Visit (HOSPITAL_COMMUNITY): Payer: Self-pay | Admitting: Family Medicine

## 2018-08-04 DIAGNOSIS — Z1231 Encounter for screening mammogram for malignant neoplasm of breast: Secondary | ICD-10-CM

## 2018-08-25 ENCOUNTER — Encounter (HOSPITAL_COMMUNITY): Payer: Self-pay

## 2018-08-25 ENCOUNTER — Ambulatory Visit (HOSPITAL_COMMUNITY)
Admission: RE | Admit: 2018-08-25 | Discharge: 2018-08-25 | Disposition: A | Discharge status: Home / Self Care | Payer: 59 | Source: Ambulatory Visit | Attending: Family Medicine | Admitting: Family Medicine

## 2018-08-25 DIAGNOSIS — Z1231 Encounter for screening mammogram for malignant neoplasm of breast: Secondary | ICD-10-CM | POA: Insufficient documentation

## 2019-05-28 ENCOUNTER — Telehealth: Payer: Self-pay | Admitting: Internal Medicine

## 2019-05-28 NOTE — Telephone Encounter (Signed)
[phone]  Patient ophthalmologist called needed to know about a diagnosis.  Please ask for a [facility] Tech

## 2019-05-28 NOTE — Telephone Encounter (Signed)
Called and spoke with an opthalmologic and pt was seen 3 years ago. Pt hasn't followed up with our office and has no new dx. Listed constipation, Gerd, esophagitis when pt was seen.

## 2020-07-29 ENCOUNTER — Ambulatory Visit: Payer: 59 | Admitting: Cardiology

## 2020-08-06 ENCOUNTER — Encounter: Payer: Self-pay | Admitting: Cardiology

## 2020-08-06 ENCOUNTER — Other Ambulatory Visit: Payer: Self-pay | Admitting: Cardiology

## 2020-08-06 ENCOUNTER — Ambulatory Visit (INDEPENDENT_AMBULATORY_CARE_PROVIDER_SITE_OTHER): Payer: 59

## 2020-08-06 ENCOUNTER — Encounter: Payer: Self-pay | Admitting: *Deleted

## 2020-08-06 ENCOUNTER — Ambulatory Visit (INDEPENDENT_AMBULATORY_CARE_PROVIDER_SITE_OTHER): Payer: 59 | Admitting: Cardiology

## 2020-08-06 VITALS — BP 144/84 | HR 86 | Ht 64.0 in | Wt 121.2 lb

## 2020-08-06 DIAGNOSIS — R002 Palpitations: Secondary | ICD-10-CM

## 2020-08-06 DIAGNOSIS — I10 Essential (primary) hypertension: Secondary | ICD-10-CM | POA: Diagnosis not present

## 2020-08-06 NOTE — Patient Instructions (Signed)
Medication Instructions:  Continue all current medications.  Labwork: none  Testing/Procedures:  Your physician has recommended that you wear a 14 day event monitor. Event monitors are medical devices that record the heart's electrical activity. Doctors most often us these monitors to diagnose arrhythmias. Arrhythmias are problems with the speed or rhythm of the heartbeat. The monitor is a small, portable device. You can wear one while you do your normal daily activities. This is usually used to diagnose what is causing palpitations/syncope (passing out).  Office will contact with results via phone or letter.    Follow-Up: 1 month   Any Other Special Instructions Will Be Listed Below (If Applicable).  If you need a refill on your cardiac medications before your next appointment, please call your pharmacy.  

## 2020-08-06 NOTE — Progress Notes (Signed)
Clinical Summary Ms. Haye is a 60 y.o.female seen today as a new consult, referred by Dr Tracie Harrier for palpitations.   1. Palpitations - recent symptoms of heart racing. Can occur at rest or with activity. Can have some SOB - lasts few minutes. Variable in frequency, recently has been 1-2 times per week - intermittently has been on prednisone for eye issues  - decaf coffee x 1 cup, one mtn dew, no EtoH   - has stopped taking benicar x 4 weeks, ongoing symptoms.  - last prednisone dose about 3 weeks ago.     2. HTN - compliant with meds - reports some vision troubles on norvasc, eye doctor stopped.      SH: works as Agricultural consultant for [employer]    Past Medical History:  Diagnosis Date  . Abnormal Pap smear   . Allergy   . Anemia   . Anxiety   . Epistaxis   . GERD (gastroesophageal reflux disease)   . HSV-2 (herpes simplex virus 2) infection   . Hyperlipidemia   . Hypertension      Allergies  Allergen Reactions  . Ivp Dye [Iodinated Diagnostic Agents] Hives, Shortness Of Breath and Swelling     Current Outpatient Medications  Medication Sig Dispense Refill  . albuterol (PROVENTIL HFA;VENTOLIN HFA) 108 (90 Base) MCG/ACT inhaler Inhale 2 puffs into the lungs every 6 (six) hours as needed for wheezing or shortness of breath. 1 Inhaler 3  . B Complex Vitamins (VITAMIN-B COMPLEX PO) Take 1 tablet by mouth daily.    Marland Kitchen buPROPion (WELLBUTRIN XL) 300 MG 24 hr tablet Take 1 tablet (300 mg total) by mouth daily. 30 tablet 2  . Cholecalciferol (VITAMIN D PO) Take 1 tablet by mouth daily.    Marland Kitchen linaclotide (LINZESS) 145 MCG CAPS capsule Take 1 capsule (145 mcg total) by mouth daily as needed. 90 capsule 1  . metoprolol tartrate (LOPRESSOR) 25 MG tablet Take 1 tablet (25 mg total) by mouth 2 (two) times daily. 180 tablet 1  . pantoprazole (PROTONIX) 40 MG tablet Take 1 tablet (40 mg total) by mouth 2 (two) times daily. 180 tablet 0  . rosuvastatin (CRESTOR) 5 MG  tablet Take 1 tablet (5 mg total) by mouth daily. 90 tablet 1  . traZODone (DESYREL) 50 MG tablet Take 0.5-1 tablets (25-50 mg total) by mouth at bedtime as needed for sleep. 90 tablet 1  . Vitamin D, Ergocalciferol, (DRISDOL) 50000 units CAPS capsule Take 1 capsule (50,000 Units total) by mouth every 7 (seven) days. 12 capsule 0   No current facility-administered medications for this visit.     Past Surgical History:  Procedure Laterality Date  .  funduplication    . CHOLECYSTECTOMY     Dr. Leona Carry  . COLONOSCOPY N/A 10/23/2012   Dr. Jena Gauss- normal rectum (anal papilla), colon and terminal ileum  . ESOPHAGOGASTRODUODENOSCOPY N/A 05/05/2015   Dr. Jena Gauss: erosive reflux esophagitis, multiple antral ulcers/erosions, prior fundoplication intact. Negative H.pylori   . ESOPHAGOGASTRODUODENOSCOPY N/A 08/13/2015   Procedure: ESOPHAGOGASTRODUODENOSCOPY (EGD);  Surgeon: Corbin Ade, MD;  [facility]: [facility];  Service: Endoscopy;  Laterality: N/A;  230   . TUBAL LIGATION       Allergies  Allergen Reactions  . Ivp Dye [Iodinated Diagnostic Agents] Hives, Shortness Of Breath and Swelling      Family History  Problem Relation Age of Onset  . Lung cancer Mother   . Alcohol abuse Mother   . Cancer Mother  lung  . COPD Mother   . Alcoholism Father   . Alcohol abuse Father   . Early death Father 21       murder  . Cancer Sister        cervical  . Depression Brother   . Colon cancer Neg Hx      Social History Ms. Geno reports that she has been smoking cigarettes. She started smoking about 42 years ago. She has a 15.00 pack-year smoking history. She has never used smokeless tobacco. Ms. Seim reports no history of alcohol use.   Review of Systems CONSTITUTIONAL: No weight loss, fever, chills, weakness or fatigue.  HEENT: Eyes: No visual loss, blurred vision, double vision or yellow sclerae.No hearing loss, sneezing, congestion, runny nose or sore throat.  SKIN: No  rash or itching.  CARDIOVASCULAR: per hpi RESPIRATORY: No shortness of breath, cough or sputum.  GASTROINTESTINAL: No anorexia, nausea, vomiting or diarrhea. No abdominal pain or blood.  GENITOURINARY: No burning on urination, no polyuria NEUROLOGICAL: No headache, dizziness, syncope, paralysis, ataxia, numbness or tingling in the extremities. No change in bowel or bladder control.  MUSCULOSKELETAL: No muscle, back pain, joint pain or stiffness.  LYMPHATICS: No enlarged nodes. No history of splenectomy.  PSYCHIATRIC: No history of depression or anxiety.  ENDOCRINOLOGIC: No reports of sweating, cold or heat intolerance. No polyuria or polydipsia.  Marland Kitchen   Physical Examination Today's Vitals   08/06/20 1344  BP: (!) 144/84  Pulse: 86  SpO2: 98%  Weight: 121 lb 3.2 oz (55 kg)  Height: 5\' 4"  (1.626 m)   Body mass index is 20.8 kg/m.  Gen: resting comfortably, no acute distress HEENT: no scleral icterus, pupils equal round and reactive, no palptable cervical adenopathy,  CV: RRR, no m/r/g, no jvd Resp: Clear to auscultation bilaterally GI: abdomen is soft, non-tender, non-distended, normal bowel sounds, no hepatosplenomegaly MSK: extremities are warm, no edema.  Skin: warm, no rash Neuro:  no focal deficits Psych: appropriate affect     Assessment and Plan  1. Palpitations - EKG today shows NSR - will plan for 2 week event monitor to further evaluate  2. HTN - she is to restart benicar, follow bp's    , M.D.

## 2020-08-20 DIAGNOSIS — R002 Palpitations: Secondary | ICD-10-CM | POA: Diagnosis not present

## 2020-08-26 ENCOUNTER — Ambulatory Visit: Payer: 59 | Admitting: Cardiology

## 2020-09-01 ENCOUNTER — Ambulatory Visit: Payer: Self-pay | Admitting: Cardiovascular Disease

## 2020-09-07 DIAGNOSIS — R002 Palpitations: Secondary | ICD-10-CM | POA: Insufficient documentation

## 2020-09-07 NOTE — Progress Notes (Signed)
Cardiology Office Note  Date: 09/08/2020   ID: Suzanne, Morton [birth year], MRN [id number]  PCP:  Aliene Beams, MD  Cardiologist:  No primary care provider on file. Electrophysiologist:  None   Chief Complaint: follow up palpitations  History of Present Illness: Suzanne Morton is a 60 y.o. female with a history of palpitations,HTM, HLD, tobacco abuse.  Last encounter with Dr. Wyline Mood 08/06/2020 for complaints of heart racing.  Are occurring at rest or with activity.  Could have associated shortness of breath.  Recently occurring 1-2 times per week.  Had stopped her Benicar x4 weeks for ongoing symptoms.  Had been taking prednisone for her eye issues.  1 cup of decaf coffee per day, 1 Mountain Dew per day, no EtOH.  Compliant with hypertension medications.  Ophthalmologist had previously stopped Norvasc due to vision issues.  Plans were for 2-week monitor and she was resuming Benicar and would follow BPs.  She is here today for follow-up from recent cardiac monitor.  She states she is having significant amount of stress at her workplace.  She is continuing to smoke.  She is wondering if the stress and other issue may be contributing to palpitations.  She continues to take Toprol-XL 25 mg daily.  States she is drinking decaf coffee and a couple sips of [facility] in the mornings.  States she smokes about 5 to 6 cigarettes/day.  Continues to have occasional palpitations which she attributes to likely being such stress at work.  Otherwise she denies any anginal or exertional symptoms, orthostatic symptoms, CVA or TIA-like symptoms, PND, orthopnea, bleeding issues.  No claudication-like symptoms, DVT or PE-like symptoms, lower extremity edema.    Past Medical History:  Diagnosis Date  . Abnormal Pap smear   . Allergy   . Anemia   . Anxiety   . Epistaxis   . GERD (gastroesophageal reflux disease)   . HSV-2 (herpes simplex virus 2) infection   . Hyperlipidemia   . Hypertension      Past Surgical History:  Procedure Laterality Date  .  funduplication    . CHOLECYSTECTOMY     Dr. Leona Carry  . COLONOSCOPY N/A 10/23/2012   Dr. Jena Gauss- normal rectum (anal papilla), colon and terminal ileum  . ESOPHAGOGASTRODUODENOSCOPY N/A 05/05/2015   Dr. Jena Gauss: erosive reflux esophagitis, multiple antral ulcers/erosions, prior fundoplication intact. Negative H.pylori   . ESOPHAGOGASTRODUODENOSCOPY N/A 08/13/2015   Procedure: ESOPHAGOGASTRODUODENOSCOPY (EGD);  Surgeon: Corbin Ade, MD;  Location: [facility];  Service: Endoscopy;  Laterality: N/A;  230   . TUBAL LIGATION      Current Outpatient Medications  Medication Sig Dispense Refill  . Adalimumab 40 MG/0.4ML PNKT Inject 40 mg into the skin every 14 (fourteen) days.    Marland Kitchen albuterol (PROVENTIL HFA;VENTOLIN HFA) 108 (90 Base) MCG/ACT inhaler Inhale 2 puffs into the lungs every 6 (six) hours as needed for wheezing or shortness of breath. 1 Inhaler 3  . aspirin EC 81 MG tablet Take 81 mg by mouth daily. Swallow whole.    . B Complex Vitamins (VITAMIN-B COMPLEX PO) Take 1 tablet by mouth daily.    . folic acid (FOLVITE) 1 MG tablet Take 5 mg by mouth daily.    . methotrexate 2.5 MG tablet Take 8 tabs once a week    . metoprolol succinate (TOPROL-XL) 25 MG 24 hr tablet Take 25 mg by mouth daily.    Marland Kitchen olmesartan (BENICAR) 40 MG tablet Take 20 mg by mouth daily.    Marland Kitchen  pantoprazole (PROTONIX) 40 MG tablet Take 1 tablet (40 mg total) by mouth 2 (two) times daily. 180 tablet 0  . rosuvastatin (CRESTOR) 5 MG tablet Take 1 tablet (5 mg total) by mouth daily. 90 tablet 1  . Vitamin D, Ergocalciferol, (DRISDOL) 50000 units CAPS capsule Take 1 capsule (50,000 Units total) by mouth every 7 (seven) days. 12 capsule 0   No current facility-administered medications for this visit.   Allergies:  Ivp dye [iodinated diagnostic agents]   Social History: The patient  reports that she has been smoking cigarettes. She started smoking about 42 years  ago. She has a 15.00 pack-year smoking history. She has never used smokeless tobacco. She reports that she does not drink alcohol and does not use drugs.   Family History: The patient's family history includes Alcohol abuse in her father and mother; Alcoholism in her father; COPD in her mother; Cancer in her mother and sister; Depression in her brother; Early death (age of onset: 78) in her father; Lung cancer in her mother.   ROS:  Please see the history of present illness. Otherwise, complete review of systems is positive for none.  All other systems are reviewed and negative.   Physical Exam: VS:  BP 120/78   Pulse 70   Ht 5\' 2"  (1.575 m)   Wt 121 lb (54.9 kg)   SpO2 93%   BMI 22.13 kg/m , BMI Body mass index is 22.13 kg/m.  Wt Readings from Last 3 Encounters:  09/08/20 121 lb (54.9 kg)  08/06/20 121 lb 3.2 oz (55 kg)  04/05/18 120 lb (54.4 kg)    General: Patient appears comfortable at rest. Neck: Supple, no elevated JVP or carotid bruits, no thyromegaly. Lungs: Clear to auscultation, nonlabored breathing at rest. Cardiac: Regular rate and rhythm, no S3 or significant systolic murmur, no pericardial rub. Extremities: No pitting edema, distal pulses 2+. Skin: Warm and dry. Musculoskeletal: No kyphosis. Neuropsychiatric: Alert and oriented x3, affect grossly appropriate.  ECG:    Recent Labwork: No results found for requested labs within last 8760 hours.     Component Value Date/Time   CHOL 162 10/26/2017 0854   TRIG 196 (H) 10/26/2017 0854   HDL 49 (L) 10/26/2017 0854   CHOLHDL 3.3 10/26/2017 0854   LDLCALC 84 10/26/2017 0854    Other Studies Reviewed Today:  Zio monitor 08/06/2020   Patch Wear Time:  14 days and 0 hours (2022-01-19T14:29:19-0500 to 2022-02-02T14:29:23-0500)  Patient had a min HR of 53 bpm, max HR of 179 bpm, and avg HR of 83 bpm. Predominant underlying rhythm was Sinus Rhythm. 7 Supraventricular Tachycardia runs occurred, the run with the fastest  interval lasting 5 beats with a max rate of 179 bpm, the  longest lasting 14 beats with an avg rate of 140 bpm. Isolated SVEs were rare (<1.0%), SVE Couplets were rare (<1.0%), and SVE Triplets were rare (<1.0%). Isolated VEs were rare (<1.0%), and no VE Couplets or VE Triplets were present   Assessment and Plan:  1. Essential hypertension   2. Palpitations    1. Palpitations Continues with intermittent palpitations approximately twice a week.  We reviewed the recent cardiac monitor where she had 7 episode of SVT.  The fastest of which was 179 lasting for 5 beats and longest lasting was 14 beats in the 140 range.  Continue Toprol-XL 25 mg daily.  2. Essential hypertension Blood pressure well controlled today at 120/78.  Continue Benicar 40 mg daily.  3.  Hyperlipidemia Continue  Crestor 5 mg daily.  Continue aspirin 81 mg daily.  Medication Adjustments/Labs and Tests Ordered: Current medicines are reviewed at length with the patient today.  Concerns regarding medicines are outlined above.   Disposition: Follow-up with Dr. Wyline Mood or APP as needed  Signed, Rennis Harding, NP 09/08/2020 8:43 AM    [facility] at [facility] [address] [place], [place], [place] [address] Phone: [phone]; Fax: [phone]

## 2020-09-08 ENCOUNTER — Ambulatory Visit (INDEPENDENT_AMBULATORY_CARE_PROVIDER_SITE_OTHER): Payer: 59 | Admitting: Family Medicine

## 2020-09-08 ENCOUNTER — Encounter: Payer: Self-pay | Admitting: Family Medicine

## 2020-09-08 VITALS — BP 120/78 | HR 70 | Ht 62.0 in | Wt 121.0 lb

## 2020-09-08 DIAGNOSIS — R002 Palpitations: Secondary | ICD-10-CM

## 2020-09-08 DIAGNOSIS — I10 Essential (primary) hypertension: Secondary | ICD-10-CM

## 2020-09-08 NOTE — Patient Instructions (Signed)
Medication Instructions:  Continue all current medications.  Labwork: none  Testing/Procedures: none  Follow-Up: As needed.    Any Other Special Instructions Will Be Listed Below (If Applicable).  If you need a refill on your cardiac medications before your next appointment, please call your pharmacy.  

## 2020-09-09 ENCOUNTER — Other Ambulatory Visit (HOSPITAL_COMMUNITY): Payer: Self-pay | Admitting: Ophthalmology

## 2020-09-09 ENCOUNTER — Other Ambulatory Visit: Payer: Self-pay | Admitting: Ophthalmology

## 2020-09-09 DIAGNOSIS — H30033 Focal chorioretinal inflammation, peripheral, bilateral: Secondary | ICD-10-CM

## 2020-09-22 ENCOUNTER — Other Ambulatory Visit: Payer: Self-pay

## 2020-09-22 ENCOUNTER — Ambulatory Visit (HOSPITAL_COMMUNITY): Payer: 59

## 2020-09-22 ENCOUNTER — Encounter (HOSPITAL_COMMUNITY): Payer: Self-pay

## 2020-09-22 ENCOUNTER — Ambulatory Visit (HOSPITAL_COMMUNITY)
Admission: RE | Admit: 2020-09-22 | Discharge: 2020-09-22 | Disposition: A | Discharge status: Home / Self Care | Payer: 59 | Source: Ambulatory Visit | Attending: Ophthalmology | Admitting: Ophthalmology

## 2020-09-22 DIAGNOSIS — H30033 Focal chorioretinal inflammation, peripheral, bilateral: Secondary | ICD-10-CM | POA: Diagnosis not present

## 2020-09-22 MED ORDER — GADOBUTROL 1 MMOL/ML IV SOLN
7.0000 mL | Freq: Once | INTRAVENOUS | Status: AC | PRN
  Administered 2020-09-22: 7 mL via INTRAVENOUS

## 2020-11-05 ENCOUNTER — Encounter: Payer: Self-pay | Admitting: Internal Medicine

## 2021-01-06 ENCOUNTER — Ambulatory Visit (INDEPENDENT_AMBULATORY_CARE_PROVIDER_SITE_OTHER): Payer: 59 | Admitting: Internal Medicine

## 2021-01-06 ENCOUNTER — Encounter: Payer: Self-pay | Admitting: Internal Medicine

## 2021-01-06 ENCOUNTER — Other Ambulatory Visit: Payer: Self-pay

## 2021-01-06 VITALS — BP 139/79 | HR 77 | Temp 97.5°F | Ht 63.0 in | Wt 119.8 lb

## 2021-01-06 DIAGNOSIS — K5909 Other constipation: Secondary | ICD-10-CM

## 2021-01-06 DIAGNOSIS — J440 Chronic obstructive pulmonary disease with acute lower respiratory infection: Secondary | ICD-10-CM | POA: Diagnosis not present

## 2021-01-06 DIAGNOSIS — K219 Gastro-esophageal reflux disease without esophagitis: Secondary | ICD-10-CM

## 2021-01-06 DIAGNOSIS — J209 Acute bronchitis, unspecified: Secondary | ICD-10-CM | POA: Insufficient documentation

## 2021-01-06 DIAGNOSIS — K279 Peptic ulcer, site unspecified, unspecified as acute or chronic, without hemorrhage or perforation: Secondary | ICD-10-CM

## 2021-01-06 DIAGNOSIS — K5900 Constipation, unspecified: Secondary | ICD-10-CM

## 2021-01-06 DIAGNOSIS — R1013 Epigastric pain: Secondary | ICD-10-CM

## 2021-01-06 DIAGNOSIS — F320 Major depressive disorder, single episode, mild: Secondary | ICD-10-CM

## 2021-01-06 HISTORY — DX: Acute bronchitis, unspecified: J44.0

## 2021-01-06 HISTORY — DX: Acute bronchitis, unspecified: J20.9

## 2021-01-06 HISTORY — DX: Major depressive disorder, single episode, mild: F32.0

## 2021-01-06 MED ORDER — DEXLANSOPRAZOLE 60 MG PO CPDR
60.0000 mg | DELAYED_RELEASE_CAPSULE | Freq: Every day | ORAL | 0 refills | Status: DC
Start: 2021-01-06 — End: 2021-06-01

## 2021-01-06 MED ORDER — LINACLOTIDE 290 MCG PO CAPS: 290.0000 ug | ORAL_CAPSULE | Freq: Every day | ORAL | 11 refills | Status: DC

## 2021-01-06 MED ORDER — DEXLANSOPRAZOLE 60 MG PO CPDR: 60.0000 mg | DELAYED_RELEASE_CAPSULE | Freq: Every day | ORAL | 0 refills | Status: DC

## 2021-01-06 MED ORDER — LINACLOTIDE 290 MCG PO CAPS: 290.0000 ug | ORAL_CAPSULE | Freq: Every day | ORAL | 0 refills | Status: DC

## 2021-01-06 NOTE — Progress Notes (Signed)
done

## 2021-01-06 NOTE — Progress Notes (Addendum)
Primary Care Physician:  Aliene Beams, MD Primary Gastroenterologist:  Dr.   Pre-Procedure History & Physical: HPI:  Suzanne Morton is a 60 y.o. female here for further evaluation of epigastric /  right upper quadrant abdominal pain in the setting of constipation and longstanding GERD.  Status post distant fundoplication with breakthrough GERD symptoms and reflux esophagitis demonstrated on prior EGD in  spite of fundoplication. No dysphagia.  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  Already on adalimumab; she notes temporal association with dyspepsia /reflux symptoms after taking methotrexate.  Recent normal LFTs. She went on Dexilant 60 mg daily for 3 weeks with perceived resolution.  Because of cost she has not been able to continue Dexilant.  She is on a novel combination of omeprazole and Protonix-this combination has not been nearly as effective.  She denies NSAID use.  GB is out. Prior CT and ultrasound revealed a normal-appearing liver but no recent imaging. Chronic constipation continue to be an issue.  Did well on Linzess previously came off that medication.  Has a bowel movement on the order of once weekly. No melena or rectal bleeding.  Past Medical History:  Diagnosis Date   Abnormal Pap smear    Allergy    Anemia    Anxiety    Epistaxis    GERD (gastroesophageal reflux disease)    HSV-2 (herpes simplex virus 2) infection    Hyperlipidemia    Hypertension     Past Surgical History:  Procedure Laterality Date    funduplication     CHOLECYSTECTOMY     Dr. Leona Carry   COLONOSCOPY N/A 10/23/2012   Dr. Jena Gauss- normal rectum (anal papilla), colon and terminal ileum   ESOPHAGOGASTRODUODENOSCOPY N/A 05/05/2015   Dr. Jena Gauss: erosive reflux esophagitis, multiple antral ulcers/erosions, prior fundoplication intact. Negative H.pylori    ESOPHAGOGASTRODUODENOSCOPY N/A 08/13/2015   Procedure:  ESOPHAGOGASTRODUODENOSCOPY (EGD);  Surgeon: Corbin Ade, MD;  Location: [facility];  Service: Endoscopy;  Laterality: N/A;  230    TUBAL LIGATION      Prior to Admission medications   Medication Sig Start Date End Date Taking? Authorizing Provider  Adalimumab 40 MG/0.4ML PNKT Inject 40 mg into the skin every 14 (fourteen) days. 05/20/20  Yes [provider]  albuterol (PROVENTIL HFA;VENTOLIN HFA) 108 (90 Base) MCG/ACT inhaler Inhale 2 puffs into the lungs every 6 (six) hours as needed for wheezing or shortness of breath. 04/11/18  Yes Hagler, Fleet Contras, MD  B Complex Vitamins (VITAMIN-B COMPLEX PO) Take 1 tablet by mouth daily.   Yes [provider]  esomeprazole (NEXIUM) 20 MG capsule Take 20 mg by mouth daily at 12 noon.   Yes [provider]  folic acid (FOLVITE) 1 MG tablet Take 5 mg by mouth daily.   Yes [provider]  methotrexate 2.5 MG tablet Take 8 tabs once a week 07/13/20  Yes [provider]  metoprolol succinate (TOPROL-XL) 25 MG 24 hr tablet Take 25 mg by mouth daily. 06/16/20  Yes [provider]  mycophenolate (CELLCEPT) 500 MG tablet Take 500 mg by mouth 2 (two) times daily. 12/08/20  Yes [provider]  olmesartan (BENICAR) 40 MG tablet Take 20 mg by mouth daily. 05/23/20  Yes [provider]  pantoprazole (PROTONIX) 40 MG tablet Take 1 tablet (40 mg total) by mouth 2 (two) times daily. Patient taking differently: Take 40 mg by mouth daily. 04/05/18  Yes Aliene Beams, MD  rosuvastatin (CRESTOR) 5 MG tablet Take 1 tablet (5 mg total) by mouth daily. 04/05/18  Yes Aliene Beams, MD  Vitamin D, Ergocalciferol, (DRISDOL) 50000 units CAPS capsule Take 1 capsule (50,000 Units total) by mouth every 7 (seven) days. 04/11/18  Yes Aliene Beams, MD    Allergies as of 01/06/2021 - Review Complete 01/06/2021  Allergen Reaction Noted   Ivp dye [iodinated diagnostic agents] Hives, Shortness Of Breath, and Swelling  05/22/2012    Family History  Problem Relation Age of Onset   Lung cancer Mother    Alcohol abuse Mother    Cancer Mother        lung   COPD Mother    Alcoholism Father    Alcohol abuse Father    Early death Father 69       murder   Cancer Sister        cervical   Depression Brother    Colon cancer Neg Hx     Social History   Socioeconomic History   Marital status: Divorced    Spouse name: Not on file   Number of children: 2   Years of education: 12   Highest education level: Not on file  Occupational History   Occupation: Medical illustrator    Employer: [employer]  Tobacco Use   Smoking status: Every Day    Packs/day: 0.50    Years: 30.00    Pack years: 15.00    Types: Cigarettes    Start date: 07/19/1978   Smokeless tobacco: Never   Tobacco comments:    smokes 4 - 5 cigarettes  Vaping Use   Vaping Use: Never used  Substance and Sexual Activity   Alcohol use: No   Drug use: No   Sexual activity: Yes  Other Topics Concern   Not on file  Social History Narrative   Lives with brother   He is on disability.    Smokes.   Has a daughter that works as a Engineer, civil (consulting) for [employer].    Social Determinants of Health   Financial Resource Strain: Not on file  Food Insecurity: Not on file  Transportation Needs: Not on file  Physical Activity: Not on file  Stress: Not on file  Social Connections: Not on file  Intimate Partner Violence: Not on file    Review of Systems: See HPI, otherwise negative ROS  Physical Exam: BP 139/79   Pulse 77   Temp (!) 97.5 F (36.4 C)   Ht 5\' 3"  (1.6 m)   Wt 119 lb 12.8 oz (54.3 kg)   BMI 21.22 kg/m  General:   Alert,  Well-developed, well-nourished, pleasant and cooperative in NAD Mouth:  No deformity or lesions. Neck:  Supple; no masses or thyromegaly. No significant cervical adenopathy. Lungs:  Clear throughout to auscultation.   No wheezes, crackles, or rhonchi. No acute distress. Heart:  Regular rate and rhythm; no  murmurs, clicks, rubs,  or gallops. Abdomen: Non-distended, normal bowel sounds.  Soft and nontender without appreciable mass or hepatosplenomegaly.  Pulses:  Normal pulses noted. Extremities:  Without clubbing or edema. Rectal: No external lesions.  Good sphincter tone.  No mass in rectal vault.  Scant brown stool is Hemoccult negative. Impression/Plan: Pleasant 60 year old lady with a long, long history of GERD  - status post distant fundoplication with chronic intermittent breakthrough reflux symptoms. Vague epigastric /right upper quadrant abdominal pain not inconsistent with dyspepsia.  No alarm symptoms at this time.  Temporally related  to methotrexate.  LFTs normal. By her report, essentially resolution of the above-mentioned symptoms on Dexilant for 3 weeks.  She clearly states omeprazole and Protonix,in combination, have not done as well as Dexilant. This is certainly plausible. Would like to get her dyspepsia/reflux symptoms under control better management of constipation prior to making any further recommendations.  At this time, no need for urgent endoscopy or imaging but as I told the patient, plans could always change.  Recommendations:  As discussed, omeprazole/Protonix may not be as effective as Dexilant in controlling symptoms.  Since resolution noted in your symptoms on Dexilant, I feel that we need to get you back on that specific medication and then reassess.  There are no alarm symptoms that warrant endoscopic evaluation or x-rays at this time.  Plans could always change.  For now, lets get you back on Dexilant 60 mg capsule once daily before breakfast.  (Dispense 30-[facility]-good Rx card for best pricing).  Stop omeprazole/Protonix.  Resume Linzess 290 mcg 1 daily for constipation (dispense 30 with 11 refills)  Call after you have taken Dexilant for 1 month with a progress report and we will go from there  Plan for screening colonoscopy 2024  Office visit here in 3  months     Notice: This dictation was prepared with Dragon dictation along with smaller phrase technology. Any transcriptional errors that result from this process are unintentional and may not be corrected upon review.

## 2021-01-06 NOTE — Addendum Note (Signed)
Addended by: Westley Foots I on: 01/06/2021 09:52 AM   Modules accepted: Orders

## 2021-01-06 NOTE — Patient Instructions (Signed)
As discussed, omeprazole/Protonix may not be as effective as Dexilant in controlling your symptoms.  Since you noted resolution in your symptoms on Dexilant, I feel that we need to get you back on that specific medication and then reassess.  There are no alarm symptoms that warrant endoscopic evaluation or x-rays at this time.  Plans could always change.  For now, lets get you back on Dexilant 60 mg capsule once daily before breakfast.  (Dispense 30-[facility]-good Rx card for best pricing)  Resume Linzess 290 mcg 1 daily for constipation (dispense 30 with 11 refills)  Call me after you have taken Dexilant for 1 month with a progress report and we will go from there  Plan for screening colonoscopy 2024  Office visit here in 3 months

## 2021-02-09 ENCOUNTER — Telehealth: Payer: Self-pay | Admitting: Internal Medicine

## 2021-02-09 NOTE — Telephone Encounter (Signed)
PATIENT CALLED AND SAID THE DEXILANT HELPED BUT SHE CAN NOT AFFORD IT.  HAD DISCUSSED AN ALTERNATIVE AT HER LAST OFFICE VISIT AND WOULD LIKE FOR THAT TO BE SENT TO HER PHARMACY

## 2021-02-09 NOTE — Telephone Encounter (Signed)
Spoke to pt. She stated that Dexilant is to expensive and she can't afford it. Previously was on Protonix and Nexium stated they don't work for her.   Routing to [facility] for review for tomorrow. Pt is aware.

## 2021-02-10 ENCOUNTER — Telehealth: Payer: Self-pay | Admitting: Internal Medicine

## 2021-02-10 NOTE — Telephone Encounter (Signed)
Spoke to pt. She stated that she was on Protonix and Nexium before she started the Dexilant. States she was giving a [employer] discount card last time she had ov, but pharmacy would not accept it. Wants to know if there is anything as good as Dexilant that she could afford.

## 2021-02-10 NOTE — Telephone Encounter (Signed)
Pt called asking if Lewie Loron, NP had made a decision on which medication she wanted her to try. Please advise. [phone]

## 2021-02-10 NOTE — Telephone Encounter (Signed)
Pt called in yesterday.  Will call her once Lewie Loron, NP has reviewed.

## 2021-02-10 NOTE — Telephone Encounter (Signed)
Notes from office visit state she has been on omeprazole and pantoprazole (Prilosec and Protonix). I am confused if ever tried Nexium as not mentioned in the notes? Has she taken Nexium or Prevacid?   Finally, perhaps we could look into patient assistance for Dexilant. That may be best option especially as it works the best for her.

## 2021-02-11 MED ORDER — RABEPRAZOLE SODIUM 20 MG PO TBEC: 20.0000 mg | DELAYED_RELEASE_TABLET | Freq: Every day | ORAL | 3 refills | Status: DC

## 2021-02-11 NOTE — Addendum Note (Signed)
Addended by: Gelene Mink on: 02/11/2021 09:24 PM   Modules accepted: Orders

## 2021-02-11 NOTE — Telephone Encounter (Signed)
It appears she hasn't tried Aciphex. I am sending this to her pharmacy.

## 2021-02-12 NOTE — Telephone Encounter (Signed)
Spoke to pt. Informed her to pick up RX of Aciphex. She informed me she was at pharmacy picking it up now. Was very appreciative.

## 2021-04-23 ENCOUNTER — Encounter: Payer: Self-pay | Admitting: Internal Medicine

## 2021-05-20 ENCOUNTER — Ambulatory Visit: Payer: 59 | Admitting: Gastroenterology

## 2021-05-29 ENCOUNTER — Other Ambulatory Visit: Payer: Self-pay | Admitting: Gastroenterology

## 2021-07-29 ENCOUNTER — Encounter: Payer: Self-pay | Admitting: Internal Medicine

## 2021-09-08 DIAGNOSIS — H35063 Retinal vasculitis, bilateral: Secondary | ICD-10-CM | POA: Diagnosis not present

## 2021-09-08 DIAGNOSIS — H2513 Age-related nuclear cataract, bilateral: Secondary | ICD-10-CM | POA: Diagnosis not present

## 2021-09-08 DIAGNOSIS — H44113 Panuveitis, bilateral: Secondary | ICD-10-CM | POA: Diagnosis not present

## 2021-09-08 DIAGNOSIS — H30033 Focal chorioretinal inflammation, peripheral, bilateral: Secondary | ICD-10-CM | POA: Diagnosis not present

## 2021-09-08 DIAGNOSIS — Z79899 Other long term (current) drug therapy: Secondary | ICD-10-CM | POA: Diagnosis not present

## 2021-09-09 ENCOUNTER — Telehealth: Payer: Self-pay | Admitting: Internal Medicine

## 2021-09-09 MED ORDER — DEXLANSOPRAZOLE 60 MG PO CPDR: 60.0000 mg | DELAYED_RELEASE_CAPSULE | Freq: Every day | ORAL | 3 refills | Status: DC

## 2021-09-09 NOTE — Telephone Encounter (Signed)
I sent in Dexilant. We shall see what happens.

## 2021-09-09 NOTE — Telephone Encounter (Signed)
Pt is aware of her OV in May. She has been rescheduled twice due to provider being out of office. She has multiple questions about medications, upper abd pain, wanting to have an U/S scheduled, etc. She is aware of OV and is on the cancellation list. I offered her a Virtual, but she wants to do face to face. Please call [phone]

## 2021-09-09 NOTE — Telephone Encounter (Signed)
Pt was made aware and verbalized understanding.  

## 2021-09-09 NOTE — Telephone Encounter (Signed)
Pt is wanting to see if generic dexilant will be covered through her insurance since she has already tried and failed rabeprazole, pantoprazole and esomeprazole. Pt would like for that to be sent in to [facility] if possible so that she can see if it works better for her before her appointment in April.

## 2021-09-10 NOTE — Telephone Encounter (Signed)
PA for Dexilant 60 Mg DR cap has been approved from 09/09/2021 to 09/09/2022. Approval letter to be scanned into patient's chart.

## 2021-09-11 ENCOUNTER — Ambulatory Visit: Payer: 59 | Admitting: Gastroenterology

## 2021-10-09 ENCOUNTER — Other Ambulatory Visit (HOSPITAL_COMMUNITY): Payer: Self-pay | Admitting: Family Medicine

## 2021-10-09 DIAGNOSIS — Z1231 Encounter for screening mammogram for malignant neoplasm of breast: Secondary | ICD-10-CM

## 2021-10-09 DIAGNOSIS — R8290 Unspecified abnormal findings in urine: Secondary | ICD-10-CM | POA: Diagnosis not present

## 2021-10-15 DIAGNOSIS — R0781 Pleurodynia: Secondary | ICD-10-CM | POA: Diagnosis not present

## 2021-10-15 DIAGNOSIS — M79632 Pain in left forearm: Secondary | ICD-10-CM | POA: Diagnosis not present

## 2021-11-05 ENCOUNTER — Ambulatory Visit (HOSPITAL_COMMUNITY)
Admission: RE | Admit: 2021-11-05 | Discharge: 2021-11-05 | Disposition: A | Discharge status: Home / Self Care | Payer: 59 | Source: Ambulatory Visit | Attending: Family Medicine | Admitting: Family Medicine

## 2021-11-05 DIAGNOSIS — Z1231 Encounter for screening mammogram for malignant neoplasm of breast: Secondary | ICD-10-CM

## 2021-11-10 ENCOUNTER — Telehealth: Payer: Self-pay | Admitting: Internal Medicine

## 2021-11-10 NOTE — Telephone Encounter (Signed)
Patient called and said that her insurance needs one more sheet filled out for her approval and they faxed it over yesterday  ?

## 2021-11-10 NOTE — Telephone Encounter (Signed)
Informed pt that PA has been submitted electronically and that we are waiting on an approval/denial.  ?

## 2021-11-13 NOTE — Telephone Encounter (Signed)
PA for Dexlansoprazole 60 mg DR Capsule has been approved from 11/11/2021 through 11/12/2022. Approval letter to be scanned into patient's chart.  ?

## 2021-11-17 DIAGNOSIS — H35063 Retinal vasculitis, bilateral: Secondary | ICD-10-CM | POA: Diagnosis not present

## 2021-11-17 DIAGNOSIS — H2513 Age-related nuclear cataract, bilateral: Secondary | ICD-10-CM | POA: Diagnosis not present

## 2021-11-17 DIAGNOSIS — Z79899 Other long term (current) drug therapy: Secondary | ICD-10-CM | POA: Diagnosis not present

## 2021-11-17 DIAGNOSIS — H30033 Focal chorioretinal inflammation, peripheral, bilateral: Secondary | ICD-10-CM | POA: Diagnosis not present

## 2021-11-17 DIAGNOSIS — H44113 Panuveitis, bilateral: Secondary | ICD-10-CM | POA: Diagnosis not present

## 2021-11-21 NOTE — Progress Notes (Deleted)
Referring Provider: Caren Macadam, MD Primary Care Physician:  Caren Macadam, MD Primary GI Physician: Dr. Gala Romney  No chief complaint on file.   HPI:   Suzanne Morton is a 61 y.o. female with a history of IBS, predominantly constipation but alternates back and forth. History of fundoplication for refractory GERD in remote past. EGD Oct 2016 with erosive reflux esophagitis, multiple antral ulcers/erosions in the setting of NSAIDs, prior fundoplication intact. Negative H.pylori on path. Follow-up EGD in 2017 with prior ulcers completely healed.   Last seen in our office in June 2022 with chief complaint of epigastric/RUQ abdominal pain. 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   She noteed temporal association with dyspepsia /reflux symptoms after taking methotrexate. Took Dexilant for 3 weeks with resolution, but couldn't continue due to expense. Was taking omeprazole and Protonix which was not as helpful. Constipation not adequately managed off Linzess. Recommended resuming Dexilant 60 mg daily and Linzess 290 mcg daily.   Dexilant too expensive. Reported also trying Nexium in the past. Rx for Aciphex 20 mg daily sent to pharmacy.   Patient called back in Feb 2023 asking to see if generic dexilant would be covered. PA was approved.   Today:   GERD/dyspepsia:   IBS:    Colonoscopy due in April 2024.   Past Medical History:  Diagnosis Date   Abnormal Pap smear    Acute bronchitis with COPD ([place]) 01/06/2021   Allergy    Anemia    Anxiety    Depression, major, single episode, mild ([place]) 01/06/2021   Epistaxis    GERD (gastroesophageal reflux disease)    HSV-2 (herpes simplex virus 2) infection    Hyperlipidemia    Hypertension     Past Surgical History:  Procedure Laterality Date    funduplication     CHOLECYSTECTOMY     Dr. Marnette Burgess   COLONOSCOPY N/A 10/23/2012   Dr. Gala Romney- normal rectum (anal  papilla), colon and terminal ileum   ESOPHAGOGASTRODUODENOSCOPY N/A 05/05/2015   Dr. Gala Romney: erosive reflux esophagitis, multiple antral ulcers/erosions, prior fundoplication intact. Negative H.pylori    ESOPHAGOGASTRODUODENOSCOPY N/A 08/13/2015   Procedure: ESOPHAGOGASTRODUODENOSCOPY (EGD);  Surgeon: Daneil Dolin, MD;  [facility]: [facility];  Service: Endoscopy;  Laterality: N/A;  230    TUBAL LIGATION      Current Outpatient Medications  Medication Sig Dispense Refill   Adalimumab 40 MG/0.4ML PNKT Inject 40 mg into the skin every 14 (fourteen) days.     albuterol (PROVENTIL HFA;VENTOLIN HFA) 108 (90 Base) MCG/ACT inhaler Inhale 2 puffs into the lungs every 6 (six) hours as needed for wheezing or shortness of breath. 1 Inhaler 3   B Complex Vitamins (VITAMIN-B COMPLEX PO) Take 1 tablet by mouth daily.     dexlansoprazole (DEXILANT) 60 MG capsule Take 1 capsule (60 mg total) by mouth daily. 90 capsule 3   folic acid (FOLVITE) 1 MG tablet Take 5 mg by mouth daily.     linaclotide (LINZESS) 290 MCG CAPS capsule Take 1 capsule (290 mcg total) by mouth daily before breakfast. 30 capsule 11   methotrexate 2.5 MG tablet Take 8 tabs once a week     metoprolol succinate (TOPROL-XL) 25 MG 24 hr tablet Take 25 mg by mouth daily.     mycophenolate (CELLCEPT) 500 MG tablet Take 500 mg by mouth 2 (two) times daily.     olmesartan (BENICAR) 40 MG tablet Take 20  mg by mouth daily.     RABEprazole (ACIPHEX) 20 MG tablet TAKE 1 TABLET BY MOUTH DAILY 30 tablet 11   rosuvastatin (CRESTOR) 5 MG tablet Take 1 tablet (5 mg total) by mouth daily. 90 tablet 1   Vitamin D, Ergocalciferol, (DRISDOL) 50000 units CAPS capsule Take 1 capsule (50,000 Units total) by mouth every 7 (seven) days. 12 capsule 0   No current facility-administered medications for this visit.    Allergies as of 11/23/2021 - Review Complete 01/06/2021  Allergen Reaction Noted   Ivp dye [iodinated contrast media] Hives, Shortness Of Breath,  and Swelling 05/22/2012    Family History  Problem Relation Age of Onset   Lung cancer Mother    Alcohol abuse Mother    Cancer Mother        lung   COPD Mother    Alcoholism Father    Alcohol abuse Father    Early death Father 74       murder   Cancer Sister        cervical   Depression Brother    Colon cancer Neg Hx     Social History   Socioeconomic History   Marital status: Divorced    Spouse name: Not on file   Number of children: 2   Years of education: 12   Highest education level: Not on file  Occupational History   Occupation: Electrical engineer    Employer: [employer]  Tobacco Use   Smoking status: Every Day    Packs/day: 0.50    Years: 30.00    Pack years: 15.00    Types: Cigarettes    Start date: 07/19/1978   Smokeless tobacco: Never   Tobacco comments:    smokes 4 - 5 cigarettes  Vaping Use   Vaping Use: Never used  Substance and Sexual Activity   Alcohol use: No   Drug use: No   Sexual activity: Yes  Other Topics Concern   Not on file  Social History Narrative   Lives with brother   He is on disability.    Smokes.   Has a daughter that works as a Marine scientist for [employer].    Social Determinants of Health   Financial Resource Strain: Not on file  Food Insecurity: Not on file  Transportation Needs: Not on file  Physical Activity: Not on file  Stress: Not on file  Social Connections: Not on file    Review of Systems: Gen: Denies fever, chills, cold or flulike symptoms, presyncope, syncope. CV: Denies chest pain, palpitations. Resp: Denies dyspnea or cough. GI: See HPI Heme: See HPI  Physical Exam: There were no vitals taken for this visit. General:   Alert and oriented. No distress noted. Pleasant and cooperative.  Head:  Normocephalic and atraumatic. Eyes:  Conjuctiva clear without scleral icterus. Heart:  S1, S2 present without murmurs appreciated. Lungs:  Clear to auscultation bilaterally. No wheezes, rales, or rhonchi. No  distress.  Abdomen:  +BS, soft, non-tender and non-distended. No rebound or guarding. No HSM or masses noted. Msk:  Symmetrical without gross deformities. Normal posture. Extremities:  Without edema. Neurologic:  Alert and  oriented x4 Psych:  Normal mood and affect.    Assessment:  61 y.o. female with a history of IBS, predominantly constipation but alternates back and forth. History of fundoplication for refractory GERD in remote past. EGD Oct 2016 with erosive reflux esophagitis, multiple antral ulcers/erosions in the setting of NSAIDs, prior fundoplication intact. Negative H.pylori on path. Follow-up  EGD in 2017 with prior ulcers completely healed. She is presenting today for follow-up of dyspepsia and IBS.   Dyspepsia:    IBS:    GERD:         Plan:  ***   Aliene Altes, PA-C [facility] 11/23/2021

## 2021-11-23 ENCOUNTER — Encounter: Payer: Self-pay | Admitting: Internal Medicine

## 2021-11-23 ENCOUNTER — Ambulatory Visit: Payer: Self-pay | Admitting: Gastroenterology

## 2022-01-06 DIAGNOSIS — R7303 Prediabetes: Secondary | ICD-10-CM | POA: Diagnosis not present

## 2022-01-06 DIAGNOSIS — J4521 Mild intermittent asthma with (acute) exacerbation: Secondary | ICD-10-CM | POA: Diagnosis not present

## 2022-01-06 DIAGNOSIS — E559 Vitamin D deficiency, unspecified: Secondary | ICD-10-CM | POA: Diagnosis not present

## 2022-01-06 DIAGNOSIS — E782 Mixed hyperlipidemia: Secondary | ICD-10-CM | POA: Diagnosis not present

## 2022-01-06 DIAGNOSIS — R69 Illness, unspecified: Secondary | ICD-10-CM | POA: Diagnosis not present

## 2022-01-06 DIAGNOSIS — H30033 Focal chorioretinal inflammation, peripheral, bilateral: Secondary | ICD-10-CM | POA: Diagnosis not present

## 2022-01-06 DIAGNOSIS — I10 Essential (primary) hypertension: Secondary | ICD-10-CM | POA: Diagnosis not present

## 2022-01-06 DIAGNOSIS — Z79899 Other long term (current) drug therapy: Secondary | ICD-10-CM | POA: Diagnosis not present

## 2022-01-06 DIAGNOSIS — R3129 Other microscopic hematuria: Secondary | ICD-10-CM | POA: Diagnosis not present

## 2022-01-21 ENCOUNTER — Other Ambulatory Visit: Payer: Self-pay | Admitting: Urology

## 2022-01-21 DIAGNOSIS — R3915 Urgency of urination: Secondary | ICD-10-CM | POA: Diagnosis not present

## 2022-01-21 DIAGNOSIS — N393 Stress incontinence (female) (male): Secondary | ICD-10-CM | POA: Diagnosis not present

## 2022-01-21 DIAGNOSIS — R3121 Asymptomatic microscopic hematuria: Secondary | ICD-10-CM | POA: Diagnosis not present

## 2022-01-31 ENCOUNTER — Inpatient Hospital Stay: Admission: RE | Admit: 2022-01-31 | Payer: 59 | Source: Ambulatory Visit

## 2022-02-02 ENCOUNTER — Other Ambulatory Visit: Payer: 59

## 2022-02-07 ENCOUNTER — Ambulatory Visit
Admission: RE | Admit: 2022-02-07 | Discharge: 2022-02-07 | Disposition: A | Discharge status: Home / Self Care | Payer: 59 | Source: Ambulatory Visit

## 2022-02-07 ENCOUNTER — Ambulatory Visit
Admission: RE | Admit: 2022-02-07 | Discharge: 2022-02-07 | Disposition: A | Discharge status: Home / Self Care | Payer: 59 | Source: Ambulatory Visit | Attending: Urology | Admitting: Urology

## 2022-02-07 DIAGNOSIS — R3121 Asymptomatic microscopic hematuria: Secondary | ICD-10-CM

## 2022-02-07 DIAGNOSIS — N139 Obstructive and reflux uropathy, unspecified: Secondary | ICD-10-CM

## 2022-02-07 MED ORDER — GADOBUTROL 1 MMOL/ML IV SOLN
5.0000 mL | Freq: Once | INTRAVENOUS | Status: AC | PRN
  Administered 2022-02-07: 5 mL via INTRAVENOUS

## 2022-02-08 ENCOUNTER — Other Ambulatory Visit: Payer: Self-pay | Admitting: Urology

## 2022-02-08 DIAGNOSIS — N139 Obstructive and reflux uropathy, unspecified: Secondary | ICD-10-CM

## 2022-03-16 DIAGNOSIS — Z79899 Other long term (current) drug therapy: Secondary | ICD-10-CM | POA: Diagnosis not present

## 2022-03-16 DIAGNOSIS — H30033 Focal chorioretinal inflammation, peripheral, bilateral: Secondary | ICD-10-CM | POA: Diagnosis not present

## 2022-03-16 DIAGNOSIS — H2513 Age-related nuclear cataract, bilateral: Secondary | ICD-10-CM | POA: Diagnosis not present

## 2022-03-16 DIAGNOSIS — R7303 Prediabetes: Secondary | ICD-10-CM | POA: Diagnosis not present

## 2022-03-16 DIAGNOSIS — H35063 Retinal vasculitis, bilateral: Secondary | ICD-10-CM | POA: Diagnosis not present

## 2022-03-16 DIAGNOSIS — H44113 Panuveitis, bilateral: Secondary | ICD-10-CM | POA: Diagnosis not present

## 2022-06-29 DIAGNOSIS — H30033 Focal chorioretinal inflammation, peripheral, bilateral: Secondary | ICD-10-CM | POA: Diagnosis not present

## 2022-06-29 DIAGNOSIS — Z79899 Other long term (current) drug therapy: Secondary | ICD-10-CM | POA: Diagnosis not present

## 2022-06-29 DIAGNOSIS — R252 Cramp and spasm: Secondary | ICD-10-CM | POA: Diagnosis not present

## 2022-07-13 DIAGNOSIS — H30033 Focal chorioretinal inflammation, peripheral, bilateral: Secondary | ICD-10-CM | POA: Diagnosis not present

## 2022-07-13 DIAGNOSIS — H2513 Age-related nuclear cataract, bilateral: Secondary | ICD-10-CM | POA: Diagnosis not present

## 2022-07-13 DIAGNOSIS — H44113 Panuveitis, bilateral: Secondary | ICD-10-CM | POA: Diagnosis not present

## 2022-07-13 DIAGNOSIS — Z79899 Other long term (current) drug therapy: Secondary | ICD-10-CM | POA: Diagnosis not present

## 2022-07-13 DIAGNOSIS — H35063 Retinal vasculitis, bilateral: Secondary | ICD-10-CM | POA: Diagnosis not present

## 2022-07-23 ENCOUNTER — Telehealth: Payer: Self-pay

## 2022-07-23 NOTE — Patient Outreach (Signed)
  Care Coordination   Initial Visit Note   07/23/2022 Name: Suzanne Morton MRN: [id number] DOB: Nov 26, 1960  Suzanne Morton is a 62 y.o. year old female who sees Caren Macadam, MD for primary care. I spoke with  Glenford Bayley by phone today.  What matters to the patients health and wellness today?  Patient declines services. She states she still works and denies care coordination, resource or disease management needs.  Goals Addressed             This Visit's Progress    COMPLETED: Care Coordination Activities       Care Coordination Interventions: Discussed care coordination program Encouraged patient to contact care coordinator and/or primary care provider, if care coordination needs in the future. Contact number provided.       SDOH assessments and interventions completed:  Yes  SDOH Interventions Today    Flowsheet Row Most Recent Value  SDOH Interventions   Food Insecurity Interventions Intervention Not Indicated  Housing Interventions Intervention Not Indicated  Utilities Interventions Intervention Not Indicated     Care Coordination Interventions:  Yes, provided   Follow up plan: No further intervention required.   Encounter Outcome:  Pt. Visit Completed   Thea Silversmith, RN, MSN, BSN, [place] Coordinator [phone]

## 2022-08-02 ENCOUNTER — Other Ambulatory Visit (HOSPITAL_COMMUNITY)
Admission: RE | Admit: 2022-08-02 | Discharge: 2022-08-02 | Disposition: A | Discharge status: Home / Self Care | Payer: 59 | Source: Ambulatory Visit | Attending: Family Medicine | Admitting: Family Medicine

## 2022-08-02 LAB — MAGNESIUM: Magnesium: 1.7 mg/dL (ref 1.7–2.4)

## 2022-08-23 ENCOUNTER — Other Ambulatory Visit: Payer: Self-pay | Admitting: Gastroenterology

## 2022-11-23 DIAGNOSIS — H35063 Retinal vasculitis, bilateral: Secondary | ICD-10-CM | POA: Diagnosis not present

## 2022-11-23 DIAGNOSIS — H30033 Focal chorioretinal inflammation, peripheral, bilateral: Secondary | ICD-10-CM | POA: Diagnosis not present

## 2022-11-23 DIAGNOSIS — Z79899 Other long term (current) drug therapy: Secondary | ICD-10-CM | POA: Diagnosis not present

## 2022-11-23 DIAGNOSIS — H2513 Age-related nuclear cataract, bilateral: Secondary | ICD-10-CM | POA: Diagnosis not present

## 2022-11-23 DIAGNOSIS — H44113 Panuveitis, bilateral: Secondary | ICD-10-CM | POA: Diagnosis not present

## 2022-11-30 ENCOUNTER — Encounter: Payer: Self-pay | Admitting: *Deleted

## 2023-02-22 DIAGNOSIS — H35063 Retinal vasculitis, bilateral: Secondary | ICD-10-CM | POA: Diagnosis not present

## 2023-02-22 DIAGNOSIS — Z79899 Other long term (current) drug therapy: Secondary | ICD-10-CM | POA: Diagnosis not present

## 2023-02-22 DIAGNOSIS — H30033 Focal chorioretinal inflammation, peripheral, bilateral: Secondary | ICD-10-CM | POA: Diagnosis not present

## 2023-02-22 DIAGNOSIS — H44113 Panuveitis, bilateral: Secondary | ICD-10-CM | POA: Diagnosis not present

## 2023-02-22 DIAGNOSIS — H2513 Age-related nuclear cataract, bilateral: Secondary | ICD-10-CM | POA: Diagnosis not present

## 2023-03-29 ENCOUNTER — Other Ambulatory Visit (HOSPITAL_COMMUNITY): Payer: Self-pay | Admitting: Family Medicine

## 2023-03-29 DIAGNOSIS — E782 Mixed hyperlipidemia: Secondary | ICD-10-CM | POA: Diagnosis not present

## 2023-03-29 DIAGNOSIS — Z0000 Encounter for general adult medical examination without abnormal findings: Secondary | ICD-10-CM | POA: Diagnosis not present

## 2023-03-29 DIAGNOSIS — R7303 Prediabetes: Secondary | ICD-10-CM | POA: Diagnosis not present

## 2023-03-29 DIAGNOSIS — F419 Anxiety disorder, unspecified: Secondary | ICD-10-CM | POA: Diagnosis not present

## 2023-03-29 DIAGNOSIS — Z1231 Encounter for screening mammogram for malignant neoplasm of breast: Secondary | ICD-10-CM

## 2023-03-29 DIAGNOSIS — Z23 Encounter for immunization: Secondary | ICD-10-CM | POA: Diagnosis not present

## 2023-03-29 DIAGNOSIS — I10 Essential (primary) hypertension: Secondary | ICD-10-CM | POA: Diagnosis not present

## 2023-03-29 DIAGNOSIS — Z79899 Other long term (current) drug therapy: Secondary | ICD-10-CM | POA: Diagnosis not present

## 2023-03-29 DIAGNOSIS — J4489 Other specified chronic obstructive pulmonary disease: Secondary | ICD-10-CM | POA: Diagnosis not present

## 2023-03-29 DIAGNOSIS — Z1211 Encounter for screening for malignant neoplasm of colon: Secondary | ICD-10-CM | POA: Diagnosis not present

## 2023-03-29 DIAGNOSIS — E559 Vitamin D deficiency, unspecified: Secondary | ICD-10-CM | POA: Diagnosis not present

## 2023-03-30 ENCOUNTER — Encounter: Payer: Self-pay | Admitting: *Deleted

## 2023-04-13 ENCOUNTER — Ambulatory Visit (HOSPITAL_COMMUNITY)
Admission: RE | Admit: 2023-04-13 | Discharge: 2023-04-13 | Disposition: A | Discharge status: Home / Self Care | Payer: 59 | Source: Ambulatory Visit | Attending: Family Medicine | Admitting: Family Medicine

## 2023-04-13 ENCOUNTER — Encounter (HOSPITAL_COMMUNITY): Payer: Self-pay

## 2023-04-13 DIAGNOSIS — Z1231 Encounter for screening mammogram for malignant neoplasm of breast: Secondary | ICD-10-CM | POA: Insufficient documentation

## 2023-06-01 DIAGNOSIS — H35063 Retinal vasculitis, bilateral: Secondary | ICD-10-CM | POA: Diagnosis not present

## 2023-06-01 DIAGNOSIS — H2513 Age-related nuclear cataract, bilateral: Secondary | ICD-10-CM | POA: Diagnosis not present

## 2023-06-01 DIAGNOSIS — H30033 Focal chorioretinal inflammation, peripheral, bilateral: Secondary | ICD-10-CM | POA: Diagnosis not present

## 2023-06-28 DIAGNOSIS — H30033 Focal chorioretinal inflammation, peripheral, bilateral: Secondary | ICD-10-CM | POA: Diagnosis not present

## 2023-06-28 DIAGNOSIS — H2513 Age-related nuclear cataract, bilateral: Secondary | ICD-10-CM | POA: Diagnosis not present

## 2023-06-28 DIAGNOSIS — H35063 Retinal vasculitis, bilateral: Secondary | ICD-10-CM | POA: Diagnosis not present

## 2023-06-28 DIAGNOSIS — H44113 Panuveitis, bilateral: Secondary | ICD-10-CM | POA: Diagnosis not present

## 2023-06-28 DIAGNOSIS — Z79899 Other long term (current) drug therapy: Secondary | ICD-10-CM | POA: Diagnosis not present

## 2023-09-28 ENCOUNTER — Encounter: Payer: Self-pay | Admitting: *Deleted

## 2023-10-11 ENCOUNTER — Encounter: Payer: Self-pay | Admitting: *Deleted

## 2023-10-14 DIAGNOSIS — H44113 Panuveitis, bilateral: Secondary | ICD-10-CM | POA: Diagnosis not present

## 2023-10-14 DIAGNOSIS — Z79899 Other long term (current) drug therapy: Secondary | ICD-10-CM | POA: Diagnosis not present

## 2023-10-14 DIAGNOSIS — H30033 Focal chorioretinal inflammation, peripheral, bilateral: Secondary | ICD-10-CM | POA: Diagnosis not present

## 2023-10-18 DIAGNOSIS — H2513 Age-related nuclear cataract, bilateral: Secondary | ICD-10-CM | POA: Diagnosis not present

## 2023-10-18 DIAGNOSIS — Z79899 Other long term (current) drug therapy: Secondary | ICD-10-CM | POA: Diagnosis not present

## 2023-10-18 DIAGNOSIS — H35063 Retinal vasculitis, bilateral: Secondary | ICD-10-CM | POA: Diagnosis not present

## 2023-10-18 DIAGNOSIS — H44113 Panuveitis, bilateral: Secondary | ICD-10-CM | POA: Diagnosis not present

## 2023-10-18 DIAGNOSIS — H30033 Focal chorioretinal inflammation, peripheral, bilateral: Secondary | ICD-10-CM | POA: Diagnosis not present

## 2023-11-23 DIAGNOSIS — M5442 Lumbago with sciatica, left side: Secondary | ICD-10-CM | POA: Diagnosis not present

## 2023-11-25 DIAGNOSIS — R252 Cramp and spasm: Secondary | ICD-10-CM | POA: Diagnosis not present

## 2023-11-28 ENCOUNTER — Other Ambulatory Visit: Payer: Self-pay | Admitting: Family Medicine

## 2023-11-28 DIAGNOSIS — R634 Abnormal weight loss: Secondary | ICD-10-CM

## 2023-11-29 ENCOUNTER — Other Ambulatory Visit: Payer: Self-pay | Admitting: Family Medicine

## 2023-11-29 DIAGNOSIS — R634 Abnormal weight loss: Secondary | ICD-10-CM

## 2023-12-02 DIAGNOSIS — M5416 Radiculopathy, lumbar region: Secondary | ICD-10-CM | POA: Diagnosis not present

## 2023-12-02 DIAGNOSIS — I10 Essential (primary) hypertension: Secondary | ICD-10-CM | POA: Diagnosis not present

## 2023-12-02 DIAGNOSIS — M533 Sacrococcygeal disorders, not elsewhere classified: Secondary | ICD-10-CM | POA: Diagnosis not present

## 2023-12-02 DIAGNOSIS — M5430 Sciatica, unspecified side: Secondary | ICD-10-CM | POA: Diagnosis not present

## 2023-12-02 DIAGNOSIS — M4807 Spinal stenosis, lumbosacral region: Secondary | ICD-10-CM | POA: Diagnosis not present

## 2023-12-02 DIAGNOSIS — M4726 Other spondylosis with radiculopathy, lumbar region: Secondary | ICD-10-CM | POA: Diagnosis not present

## 2023-12-02 DIAGNOSIS — M5432 Sciatica, left side: Secondary | ICD-10-CM | POA: Diagnosis not present

## 2023-12-02 DIAGNOSIS — M4187 Other forms of scoliosis, lumbosacral region: Secondary | ICD-10-CM | POA: Diagnosis not present

## 2023-12-02 DIAGNOSIS — F17210 Nicotine dependence, cigarettes, uncomplicated: Secondary | ICD-10-CM | POA: Diagnosis not present

## 2023-12-02 DIAGNOSIS — M47816 Spondylosis without myelopathy or radiculopathy, lumbar region: Secondary | ICD-10-CM | POA: Diagnosis not present

## 2023-12-13 ENCOUNTER — Encounter: Payer: Self-pay | Admitting: Family Medicine

## 2023-12-15 ENCOUNTER — Ambulatory Visit
Admission: RE | Admit: 2023-12-15 | Discharge: 2023-12-15 | Disposition: A | Discharge status: Home / Self Care | Source: Ambulatory Visit | Attending: Family Medicine | Admitting: Family Medicine

## 2023-12-15 DIAGNOSIS — I700 Atherosclerosis of aorta: Secondary | ICD-10-CM | POA: Diagnosis not present

## 2023-12-15 DIAGNOSIS — R918 Other nonspecific abnormal finding of lung field: Secondary | ICD-10-CM | POA: Diagnosis not present

## 2023-12-15 DIAGNOSIS — Z87891 Personal history of nicotine dependence: Secondary | ICD-10-CM | POA: Diagnosis not present

## 2023-12-15 DIAGNOSIS — R634 Abnormal weight loss: Secondary | ICD-10-CM

## 2023-12-28 DIAGNOSIS — M545 Low back pain, unspecified: Secondary | ICD-10-CM | POA: Diagnosis not present

## 2024-01-25 DIAGNOSIS — H44113 Panuveitis, bilateral: Secondary | ICD-10-CM | POA: Diagnosis not present

## 2024-01-31 DIAGNOSIS — M5416 Radiculopathy, lumbar region: Secondary | ICD-10-CM | POA: Diagnosis not present

## 2024-01-31 DIAGNOSIS — M5459 Other low back pain: Secondary | ICD-10-CM | POA: Diagnosis not present

## 2024-01-31 DIAGNOSIS — M47816 Spondylosis without myelopathy or radiculopathy, lumbar region: Secondary | ICD-10-CM | POA: Diagnosis not present

## 2024-02-01 DIAGNOSIS — H2513 Age-related nuclear cataract, bilateral: Secondary | ICD-10-CM | POA: Diagnosis not present

## 2024-02-01 DIAGNOSIS — H30033 Focal chorioretinal inflammation, peripheral, bilateral: Secondary | ICD-10-CM | POA: Diagnosis not present

## 2024-02-01 DIAGNOSIS — H35063 Retinal vasculitis, bilateral: Secondary | ICD-10-CM | POA: Diagnosis not present

## 2024-02-14 DIAGNOSIS — H44113 Panuveitis, bilateral: Secondary | ICD-10-CM | POA: Diagnosis not present

## 2024-02-21 DIAGNOSIS — H44113 Panuveitis, bilateral: Secondary | ICD-10-CM | POA: Diagnosis not present

## 2024-02-21 DIAGNOSIS — H30033 Focal chorioretinal inflammation, peripheral, bilateral: Secondary | ICD-10-CM | POA: Diagnosis not present

## 2024-02-21 DIAGNOSIS — Z79899 Other long term (current) drug therapy: Secondary | ICD-10-CM | POA: Diagnosis not present

## 2024-02-21 DIAGNOSIS — H2513 Age-related nuclear cataract, bilateral: Secondary | ICD-10-CM | POA: Diagnosis not present

## 2024-02-21 DIAGNOSIS — H35063 Retinal vasculitis, bilateral: Secondary | ICD-10-CM | POA: Diagnosis not present

## 2024-04-26 ENCOUNTER — Encounter: Payer: Self-pay | Admitting: Internal Medicine

## 2024-05-23 ENCOUNTER — Encounter: Payer: Self-pay | Admitting: Internal Medicine

## 2024-05-23 ENCOUNTER — Ambulatory Visit: Admitting: Internal Medicine

## 2024-05-23 ENCOUNTER — Telehealth: Payer: Self-pay

## 2024-05-23 VITALS — BP 179/100 | HR 75 | Temp 97.5°F | Ht 62.0 in | Wt 91.2 lb

## 2024-05-23 DIAGNOSIS — K5909 Other constipation: Secondary | ICD-10-CM | POA: Diagnosis not present

## 2024-05-23 DIAGNOSIS — Z1211 Encounter for screening for malignant neoplasm of colon: Secondary | ICD-10-CM

## 2024-05-23 DIAGNOSIS — K219 Gastro-esophageal reflux disease without esophagitis: Secondary | ICD-10-CM

## 2024-05-23 DIAGNOSIS — D508 Other iron deficiency anemias: Secondary | ICD-10-CM

## 2024-05-23 MED ORDER — DEXLANSOPRAZOLE 60 MG PO CPDR: 1.0000 | DELAYED_RELEASE_CAPSULE | Freq: Every day | ORAL | 3 refills | Status: AC

## 2024-05-23 MED ORDER — LINACLOTIDE 290 MCG PO CAPS: 290.0000 ug | ORAL_CAPSULE | Freq: Every day | ORAL | 3 refills | Status: DC

## 2024-05-23 NOTE — Progress Notes (Signed)
 Primary Care Physician:  Rolinda Millman, MD Primary Gastroenterologist:  Dr. Cindie  Chief Complaint  Patient presents with   New Patient (Initial Visit)    Patient here today due to having a history of anemia and the need for a TCS. Patient last hgb was 11.2 on Oct 09,2025.    HPI:   Suzanne Morton is a 63 y.o. female who presents to clinic today by referral from her PCP Dr. Rolinda for evaluation.  Iron-deficiency anemia: Blood work from 04/26/2024 showed hemoglobin 11.2, iron 22, ferritin 4.5.  Started on oral iron therapy.  Denies any gross melena hematochezia.  No abdominal pain or unintentional weight loss.  No family history of colorectal malignancy.  Colonoscopy 10/23/12 anal papilla otherwise normal.  Normal terminal ileum.  Chronic GERD: Currently on dexlansoprazole , GERD well-controlled.  Trialed on Protonix  and omeprazole in the past without complete resolution of symptoms.  History of Nissen fundoplication over 20 years ago.   Chronic constipation: Previously on Linzess  290 mcg daily. Requesting refills today. Constipation exacerbated by recently starting oral iron therapy.     Past Medical History:  Diagnosis Date   Abnormal Pap smear    Acute bronchitis with COPD ([facility]) 01/06/2021   Allergy    Anemia    Anxiety    Depression, major, single episode, mild 01/06/2021   Epistaxis    GERD (gastroesophageal reflux disease)    HSV-2 (herpes simplex virus 2) infection    Hyperlipidemia    Hypertension     Past Surgical History:  Procedure Laterality Date    funduplication     CHOLECYSTECTOMY     Dr. Gina   COLONOSCOPY N/A 10/23/2012   Dr. Shaaron- normal rectum (anal papilla), colon and terminal ileum   ESOPHAGOGASTRODUODENOSCOPY N/A 05/05/2015   Dr. Shaaron: erosive reflux esophagitis, multiple antral ulcers/erosions, prior fundoplication intact. Negative H.pylori    ESOPHAGOGASTRODUODENOSCOPY N/A 08/13/2015   Procedure: ESOPHAGOGASTRODUODENOSCOPY (EGD);  Surgeon:  Lamar CHRISTELLA Shaaron, MD;  Location: [facility];  Service: Endoscopy;  Laterality: N/A;  230    TUBAL LIGATION      Current Outpatient Medications  Medication Sig Dispense Refill   Adalimumab 40 MG/0.4ML PNKT Inject 40 mg into the skin every 14 (fourteen) days.     albuterol  (PROVENTIL  HFA;VENTOLIN  HFA) 108 (90 Base) MCG/ACT inhaler Inhale 2 puffs into the lungs every 6 (six) hours as needed for wheezing or shortness of breath. 1 Inhaler 3   B Complex Vitamins (VITAMIN-B COMPLEX PO) Take 1 tablet by mouth daily.     dexlansoprazole  (DEXILANT ) 60 MG capsule TAKE ONE CAPSULE BY MOUTH EVERY DAY 90 capsule 3   folic acid  (FOLVITE ) 1 MG tablet Take 5 mg by mouth daily.     linaclotide  (LINZESS ) 290 MCG CAPS capsule Take 1 capsule (290 mcg total) by mouth daily before breakfast. 30 capsule 11   methotrexate 2.5 MG tablet Take 8 tabs once a week     metoprolol  succinate (TOPROL -XL) 25 MG 24 hr tablet Take 25 mg by mouth daily.     mycophenolate (CELLCEPT) 500 MG tablet Take 500 mg by mouth 2 (two) times daily.     olmesartan (BENICAR) 40 MG tablet Take 20 mg by mouth daily.     RABEprazole  (ACIPHEX ) 20 MG tablet TAKE 1 TABLET BY MOUTH DAILY 30 tablet 11   rosuvastatin  (CRESTOR ) 5 MG tablet Take 1 tablet (5 mg total) by mouth daily. 90 tablet 1   Vitamin D , Ergocalciferol , (DRISDOL ) 50000 units CAPS capsule  Take 1 capsule (50,000 Units total) by mouth every 7 (seven) days. 12 capsule 0   No current facility-administered medications for this visit.    Allergies as of 05/23/2024 - Review Complete 01/06/2021  Allergen Reaction Noted   Ivp dye [iodinated contrast media] Hives, Shortness Of Breath, and Swelling 05/22/2012    Family History  Problem Relation Age of Onset   Lung cancer Mother    Alcohol abuse Mother    Cancer Mother        lung   COPD Mother    Alcoholism Father    Alcohol abuse Father    Early death Father 68       murder   Cancer Sister        cervical   Depression Brother     Colon cancer Neg Hx     Social History   Socioeconomic History   Marital status: Divorced    Spouse name: Not on file   Number of children: 2   Years of education: 12   Highest education level: Not on file  Occupational History   Occupation: Medical illustrator    Employer: [employer]  Tobacco Use   Smoking status: Every Day    Current packs/day: 0.50    Average packs/day: 0.5 packs/day for 45.8 years (22.9 ttl pk-yrs)    Types: Cigarettes    Start date: 07/19/1978   Smokeless tobacco: Never   Tobacco comments:    smokes 4 - 5 cigarettes  Vaping Use   Vaping status: Never Used  Substance and Sexual Activity   Alcohol use: No   Drug use: No   Sexual activity: Yes  Other Topics Concern   Not on file  Social History Narrative   Lives with brother   He is on disability.    Smokes.   Has a daughter that works as a engineer, civil (consulting) for [employer].    Social Drivers of Corporate Investment Banker Strain: Not on file  Food Insecurity: No Food Insecurity (07/23/2022)   Hunger Vital Sign    Worried About Running Out of Food in the Last Year: Never true    Ran Out of Food in the Last Year: Never true  Transportation Needs: No Transportation Needs (07/23/2022)   PRAPARE - Administrator, Civil Service (Medical): No    Lack of Transportation (Non-Medical): No  Physical Activity: Not on file  Stress: Not on file  Social Connections: Not on file  Intimate Partner Violence: Not on file    Subjective: Review of Systems  Constitutional:  Negative for chills and fever.  HENT:  Negative for congestion and hearing loss.   Eyes:  Negative for blurred vision and double vision.  Respiratory:  Negative for cough and shortness of breath.   Cardiovascular:  Negative for chest pain and palpitations.  Gastrointestinal:  Positive for constipation and heartburn. Negative for abdominal pain, blood in stool, diarrhea, melena and vomiting.  Genitourinary:  Negative for dysuria and  urgency.  Musculoskeletal:  Negative for joint pain and myalgias.  Skin:  Negative for itching and rash.  Neurological:  Negative for dizziness and headaches.  Psychiatric/Behavioral:  Negative for depression. The patient is not nervous/anxious.        Objective: BP (!) 165/84 (BP Location: Left Arm, Patient Position: Sitting, Cuff Size: Normal)   Pulse 80   Temp (!) 97.5 F (36.4 C) (Temporal)   Ht 5' 2 (1.575 m)   Wt 91 lb 3.2 oz (41.4 kg)  BMI 16.68 kg/m  Physical Exam Constitutional:      Appearance: Normal appearance.  HENT:     Head: Normocephalic and atraumatic.  Eyes:     Extraocular Movements: Extraocular movements intact.     Conjunctiva/sclera: Conjunctivae normal.  Cardiovascular:     Rate and Rhythm: Normal rate and regular rhythm.  Pulmonary:     Effort: Pulmonary effort is normal.     Breath sounds: Normal breath sounds.  Abdominal:     General: Bowel sounds are normal.     Palpations: Abdomen is soft.  Musculoskeletal:        General: No swelling. Normal range of motion.     Cervical back: Normal range of motion and neck supple.  Skin:    General: Skin is warm and dry.     Coloration: Skin is not jaundiced.  Neurological:     General: No focal deficit present.     Mental Status: She is alert and oriented to person, place, and time.  Psychiatric:        Mood and Affect: Mood normal.        Behavior: Behavior normal.      Assessment/Plan:  1.  Iron deficiency anemia, colon cancer screening-Will schedule for colonoscopy.The risks including infection, bleed, or perforation as well as benefits, limitations, alternatives and imponderables have been reviewed with the patient. Questions have been answered. All parties agreeable.  Continue on oral iron therapy.   Patient wishes to wait until January 2026 until her colonoscopy.  Discussed that we could be missing something more ominous given her anemia and she understands and is agreeable to wait.  2.   Chronic GERD-well controlled on Dexlansoprazole , refilled today.  3.  Chronic constipation-well-controlled on Linzess  290 mcg.  Refill today.  Thank you Dr. Rolinda for the kind referral.   05/23/2024 8:33 AM

## 2024-05-23 NOTE — Patient Instructions (Signed)
 We will schedule you for colonoscopy in January to further evaluate your iron deficiency anemia as well as for colon cancer screening purposes.  Continue on dexlansoprazole  for your chronic acid reflux.  I sent in refills today.  Continue Linzess  as needed for your chronic constipation.  I sent in refills today.  It was very nice meeting you today.  Dr. Cindie

## 2024-05-23 NOTE — Telephone Encounter (Signed)
 PA approved for Linzess  . Pt has tried and failed Linzess  145 mcg. Dx used: K59.04. will scan to the pt's chart

## 2024-05-29 DIAGNOSIS — H2513 Age-related nuclear cataract, bilateral: Secondary | ICD-10-CM | POA: Diagnosis not present

## 2024-05-29 DIAGNOSIS — H30033 Focal chorioretinal inflammation, peripheral, bilateral: Secondary | ICD-10-CM | POA: Diagnosis not present

## 2024-05-29 DIAGNOSIS — H35063 Retinal vasculitis, bilateral: Secondary | ICD-10-CM | POA: Diagnosis not present

## 2024-05-29 DIAGNOSIS — H44113 Panuveitis, bilateral: Secondary | ICD-10-CM | POA: Diagnosis not present

## 2024-05-29 DIAGNOSIS — Z79899 Other long term (current) drug therapy: Secondary | ICD-10-CM | POA: Diagnosis not present

## 2024-06-19 ENCOUNTER — Telehealth: Payer: Self-pay

## 2024-06-19 NOTE — Telephone Encounter (Signed)
 Documentation from [facility] regarding pt's Rx co-pay for Linzess  is $253.42 . They are wanting to know is Generic Amitiza an option for the pt. Please advise.  (Sent to Dr Cindie secure chat)

## 2024-06-28 DIAGNOSIS — J4489 Other specified chronic obstructive pulmonary disease: Secondary | ICD-10-CM | POA: Diagnosis not present

## 2024-06-28 DIAGNOSIS — R7303 Prediabetes: Secondary | ICD-10-CM | POA: Diagnosis not present

## 2024-06-28 DIAGNOSIS — K219 Gastro-esophageal reflux disease without esophagitis: Secondary | ICD-10-CM | POA: Diagnosis not present

## 2024-06-28 DIAGNOSIS — F419 Anxiety disorder, unspecified: Secondary | ICD-10-CM | POA: Diagnosis not present

## 2024-06-28 DIAGNOSIS — I10 Essential (primary) hypertension: Secondary | ICD-10-CM | POA: Diagnosis not present

## 2024-06-28 DIAGNOSIS — M5432 Sciatica, left side: Secondary | ICD-10-CM | POA: Diagnosis not present

## 2024-06-28 DIAGNOSIS — D509 Iron deficiency anemia, unspecified: Secondary | ICD-10-CM | POA: Diagnosis not present

## 2024-07-02 ENCOUNTER — Telehealth: Payer: Self-pay | Admitting: *Deleted

## 2024-07-02 MED ORDER — NA SULFATE-K SULFATE-MG SULF 17.5-3.13-1.6 GM/177ML PO SOLN: 1.0000 | ORAL | 0 refills | Status: AC

## 2024-07-02 NOTE — Telephone Encounter (Signed)
 Spoke with pt. She has been scheduled for colonoscopy in Jan with Dr. Cindie as requested. Also requested low volume prep. Advised will send to pharmacy and will mail instructions

## 2024-07-24 ENCOUNTER — Encounter (HOSPITAL_COMMUNITY)
Admission: RE | Admit: 2024-07-24 | Discharge: 2024-07-24 | Disposition: A | Discharge status: Home / Self Care | Source: Ambulatory Visit | Attending: Internal Medicine | Admitting: Internal Medicine

## 2024-07-24 ENCOUNTER — Telehealth: Payer: Self-pay | Admitting: *Deleted

## 2024-07-24 NOTE — Telephone Encounter (Signed)
 Pt requested to be rescheduled in Feb. Advised pt will call to schedule once we get providers schedule for Feb.   Message from endo: Good morning! I just got off of the phone with Suzanne Morton who is sick and wants to reschedule due to coughing and congestion.

## 2024-07-24 NOTE — Pre-Procedure Instructions (Signed)
 Called to do pre-op phone call. Patient is coughing and congested and wants to reschedule procedure. Office and Elveria Salt, [place] scheduler notified.

## 2024-07-27 ENCOUNTER — Encounter (HOSPITAL_COMMUNITY): Admission: RE | Payer: Self-pay | Source: Home / Self Care

## 2024-07-27 ENCOUNTER — Ambulatory Visit (HOSPITAL_COMMUNITY): Admission: RE | Admit: 2024-07-27 | Payer: Self-pay | Source: Home / Self Care | Admitting: Internal Medicine

## 2024-07-27 SURGERY — COLONOSCOPY
Anesthesia: Choice

## 2024-07-31 ENCOUNTER — Encounter: Payer: Self-pay | Admitting: *Deleted

## 2024-07-31 NOTE — Telephone Encounter (Signed)
 Pt has been rescheduled to 09/07/24. Updated instructions mailed.

## 2024-08-17 ENCOUNTER — Inpatient Hospital Stay: Payer: Self-pay | Admitting: Hematology

## 2024-08-17 ENCOUNTER — Inpatient Hospital Stay: Payer: Self-pay

## 2024-08-22 ENCOUNTER — Encounter: Payer: Self-pay | Admitting: Internal Medicine

## 2024-08-22 ENCOUNTER — Ambulatory Visit: Admitting: Internal Medicine

## 2024-08-22 VITALS — BP 154/80 | HR 67 | Ht 62.0 in | Wt 92.0 lb

## 2024-08-22 DIAGNOSIS — J449 Chronic obstructive pulmonary disease, unspecified: Secondary | ICD-10-CM | POA: Diagnosis not present

## 2024-08-22 DIAGNOSIS — J440 Chronic obstructive pulmonary disease with acute lower respiratory infection: Secondary | ICD-10-CM

## 2024-08-22 DIAGNOSIS — J209 Acute bronchitis, unspecified: Secondary | ICD-10-CM | POA: Diagnosis not present

## 2024-08-22 DIAGNOSIS — F17210 Nicotine dependence, cigarettes, uncomplicated: Secondary | ICD-10-CM

## 2024-08-22 MED ORDER — BREZTRI AEROSPHERE 160-9-4.8 MCG/ACT IN AERO: 2.0000 | INHALATION_SPRAY | Freq: Two times a day (BID) | RESPIRATORY_TRACT | Status: AC

## 2024-08-22 MED ORDER — AZITHROMYCIN 250 MG PO TABS: ORAL_TABLET | ORAL | 0 refills | Status: AC

## 2024-08-22 MED ORDER — PREDNISONE 10 MG PO TABS: ORAL_TABLET | ORAL | 0 refills | Status: AC

## 2024-08-22 MED ORDER — BREZTRI AEROSPHERE 160-9-4.8 MCG/ACT IN AERO: INHALATION_SPRAY | RESPIRATORY_TRACT | 11 refills | Status: AC

## 2024-08-22 NOTE — Progress Notes (Signed)
 "   Suzanne Morton, female    DOB: 28-Sep-1960    MRN: [id number]   Brief patient profile:  55   yowf active smoker grew up with smokers dx as inherited asthma age 64  referred to pulmonary clinic in [place]  08/22/2024 by Dr Rolinda  for copd /asthma on maint rx since 2023.   Pt not previously seen by PCCM service.    LDSCT:  12/14/24  1. Multiple scattered tiny bilateral pulmonary nodules measuring in the 2-4 mm size range. No follow-up needed if patient is low-risk (and has no known or suspected primary neoplasm). Non-contrast chest CT can be considered in 12 months if patient is high-risk.   2.  Aortic Atherosclerosis (ICD10-I70.0).   History of Present Illness  08/22/2024  Pulmonary/ 1st Morton eval/ Suzanne Morton / Suzanne Morton  wixella 500 x one year /cellcept for ocular inflammation   Chief Complaint  Patient presents with   Establish Care    PCP referred for shob - coughing clear  States has cold x2 weeks / congestion   Dyspnea:  still pushing mower in summer  x 15 min / carrying clothes up one flight/ no HC parking needed/ shopping is fine  Cough: worse x 2 weeks clear, thinner p rx  with mucinex / chronic cough worse x 2 years in am x sev hours to clear it  Sleep: bed is flat one pillow s noct resp cc  SABA use: q am and maybe once during the day >  02: none  LDSCT:  up to dated- has done q  May   No obvious day to day or daytime pattern/variability or assoc  purulent sputum or mucus plugs or hemoptysis or cp or chest tightness, subjective wheeze or overt sinus or hb symptoms.    Also denies any obvious fluctuation of symptoms with weather or environmental changes or other aggravating or alleviating factors except as outlined above   No unusual exposure hx or h/o childhood pna/ asthma or knowledge of premature birth.  Current Allergies, Complete Past Medical History, Past Surgical History, Family History, and Social History were reviewed in [employer]  record.  ROS  The following are not active complaints unless bolded Hoarseness, sore throat, dysphagia, dental problems, itching, sneezing,  nasal congestion or discharge of excess mucus or purulent secretions, ear ache,   fever, chills, sweats, unintended wt loss or wt gain, classically pleuritic or exertional cp,  orthopnea pnd or arm/hand swelling  or leg swelling, presyncope, palpitations, abdominal pain, anorexia, nausea, vomiting, diarrhea  or change in bowel habits or change in bladder habits, change in stools or change in urine, dysuria, hematuria,  rash, arthralgias, visual complaints, headache, numbness, weakness or ataxia or problems with walking or coordination,  change in mood or  memory.            Outpatient Medications Prior to Visit  Medication Sig Dispense Refill   albuterol  (PROVENTIL  HFA;VENTOLIN  HFA) 108 (90 Base) MCG/ACT inhaler Inhale 2 puffs into the lungs every 6 (six) hours as needed for wheezing or shortness of breath. 1 Inhaler 3   aspirin EC 81 MG tablet Take 81 mg by mouth daily. Swallow whole.     B Complex Vitamins (VITAMIN-B COMPLEX PO) Take 1 tablet by mouth daily.     dexlansoprazole  (DEXILANT ) 60 MG capsule Take 1 capsule (60 mg total) by mouth daily. 90 capsule 3   folic acid  (FOLVITE ) 1 MG tablet Take 5 mg by mouth daily.  metFORMIN (GLUCOPHAGE) 850 MG tablet Take 850 mg by mouth 2 (two) times daily with a meal.     metoprolol  succinate (TOPROL -XL) 25 MG 24 hr tablet Take 25 mg by mouth daily. (Patient taking differently: Take 50 mg by mouth daily.)     mycophenolate (CELLCEPT) 500 MG tablet Take 500 mg by mouth 2 (two) times daily.     Na Sulfate-K Sulfate-Mg Sulfate concentrate (SUPREP) 17.5-3.13-1.6 GM/177ML SOLN Take 1 kit (354 mLs total) by mouth as directed. 354 mL 0   olmesartan (BENICAR) 40 MG tablet Take 20 mg by mouth daily.     OVER THE COUNTER MEDICATION Vit D bid.     rosuvastatin  (CRESTOR ) 5 MG tablet Take 1 tablet (5 mg total) by mouth  daily. 90 tablet 1   WIXELA INHUB 500-50 MCG/ACT AEPB Inhale 1 puff into the lungs in the morning and at bedtime.     linaclotide  (LINZESS ) 290 MCG CAPS capsule Take 1 capsule (290 mcg total) by mouth daily before breakfast. 90 capsule 3   No facility-administered medications prior to visit.    Past Medical History:  Diagnosis Date   Abnormal Pap smear    Acute bronchitis with COPD (HCC) 01/06/2021   Allergy    Anemia    Anxiety    Depression, major, single episode, mild 01/06/2021   Epistaxis    GERD (gastroesophageal reflux disease)    HSV-2 (herpes simplex virus 2) infection    Hyperlipidemia    Hypertension       Objective:     BP (!) 154/80   Pulse 67   Ht 5' 2 (1.575 m)   Wt 92 lb (41.7 kg)   SpO2 98% Comment: ra  BMI 16.83 kg/m   SpO2: 98 % (ra) amb pleasant wf nad    HEENT : Oropharynx  clear   Nasal turbinates nl    NECK :  without  apparent JVD/ palpable Nodes/TM    LUNGS: no acc muscle use,  Min barrel  contour chest wall with bilateral  late exp wheeze and  without cough on insp or exp maneuvers and min  Hyperresonant  to  percussion bilaterally    CV:  RRR  no s3 or murmur or increase in P2, and no edema   ABD:  soft and nontender    MS:  Nl gait/ ext warm without deformities Or obvious joint restrictions  calf tenderness, cyanosis or clubbing     SKIN: warm and dry without lesions    NEURO:  alert, approp, nl sensorium with  no motor or cerebellar deficits apparent.            Assessment   Assessment & Plan Acute bronchitis with COPD (HCC) Active smoker - 08/22/2024  After extensive coaching inhaler device,  effectiveness =    60% from a baseline of 30% hfa > try breztri   - 08/22/2024   Walked on RA   x  3  lap(s) =  approx 450  ft  @ fast  pace, stopped due to end of stud  with lowest 02 sats 95%   Allergy screen 08/22/2024 >  Eos 0. /  IgE  with alpha one AT Phenotype/ level  =    Clearly  Group E in terms of symptoms/risk so  laba/lama/ICS   therefore appropriate rx at this point >>>  brezgtri   and approp SABA prn.  Re SABA :  I spent extra time with pt today reviewing appropriate use of albuterol  for  prn use on exertion with the following points: 1) saba is for relief of sob that does not improve by walking a slower pace or resting but rather if the pt does not improve after trying this first. 2) If the pt is convinced, as many are, that saba helps recover from activity faster then it's easy to tell if this is the case by re-challenging : ie stop, take the inhaler, then p 5 minutes try the exact same activity (intensity of workload) that just caused the symptoms and see if they are substantially diminished or not after saba 3) if there is an activity that reproducibly causes the symptoms, try the saba 15 min before the activity on alternate days   If in fact the saba really does help, then fine to continue to use it prn but advised may need to look closer at the maintenance regimen (breztri  in this case) being used to achieve better control of airways disease with exertion.    >>> zpak/ pred x 6 days for acute bronchitis with active wheeze p 2 weeks viral URI  Cigarette smoker Counseled re importance of smoking cessation but did not meet time criteria for separate billing     Low-dose CT lung cancer screening is recommended for patients who are 40-25 years of age with a 20+ pack-year history of smoking and who are currently smoking or quit <=15 years ago. No coughing up blood  No unintentional weight loss of > 15 pounds in the last 6 months - pt is eligible for scanning yearly until 83 y p quits   Discussed in detail all the  indications, usual  risks and alternatives  relative to the benefits with patient who agrees to proceed with w/u as outlined.        Each maintenance medication was reviewed in detail including emphasizing most importantly the difference between maintenance and prns and under what circumstances the prns  are to be triggered using an action plan format where appropriate.  Total time for H and P, chart review, counseling, reviewing hfa  device(s) , directly observing portions of ambulatory 02 saturation study/ and generating customized AVS unique to this Morton visit / same day charting = 46 min new pt eval                  AVS  Patient Instructions  Plan A = Automatic = Always=    Breztri  Take 2 puffs first thing in am and then another 2 puffs about 12 hours later.    Work on inhaler technique:  relax and gently blow all the way out then take a nice smooth full deep breath back in, triggering the inhaler at same time you start breathing in.  Hold breath in for at least  5 seconds if you can. Blow out Breztri   thru nose. Rinse and gargle with water  when done.  If mouth or throat bother you at all,  try brushing teeth/gums/tongue with arm and hammer toothpaste/ make a slurry and gargle and spit out.   >>>  Remember how golfers warm up by taking practice swings - do this with an empty inhaler    Plan B = Backup (to supplement plan A, not to replace it) Use your albuterol  inhaler as a rescue medication to be used if you can't catch your breath by resting or slowing your pace  or doing a relaxed purse lip breathing pattern.  - The less you use it, the better it will work when you  need it. - Ok to use the inhaler up to 2 puffs  every 4 hours if you must but call for appointment if use goes up over your usual need - Don't leave home without it !!  (think of it like the spare tire or starter fluid for your car)   Also  Ok to try albuterol  15 min before an activity (on alternating days)  that you know would usually make you short of breath and see if it makes any difference and if makes none then don't take albuterol  after activity unless you can't catch your breath as this means it's the resting that helps, not the albuterol .   Please remember to go to the lab department   for your tests - we will  call you with the results when they are available.      zpak  Prednisone  10 mg take  4 each am x 2 days,   2 each am x 2 days,  1 each am x 2 days and stop    Please schedule a follow up Morton visit in 6 weeks, call sooner if needed  PFTs           Ozell America, MD 08/22/2024      "

## 2024-08-22 NOTE — Assessment & Plan Note (Addendum)
 Active smoker - 08/22/2024  After extensive coaching inhaler device,  effectiveness =    60% from a baseline of 30% hfa > try breztri   - 08/22/2024   Walked on RA   x  3  lap(s) =  approx 450  ft  @ fast  pace, stopped due to end of stud  with lowest 02 sats 95%   Allergy screen 08/22/2024 >  Eos 0. /  IgE  with alpha one AT Phenotype/ level  =    Clearly  Group E in terms of symptoms/risk so  laba/lama/ICS  therefore appropriate rx at this point >>>  brezgtri   and approp SABA prn.  Re SABA :  I spent extra time with pt today reviewing appropriate use of albuterol  for prn use on exertion with the following points: 1) saba is for relief of sob that does not improve by walking a slower pace or resting but rather if the pt does not improve after trying this first. 2) If the pt is convinced, as many are, that saba helps recover from activity faster then it's easy to tell if this is the case by re-challenging : ie stop, take the inhaler, then p 5 minutes try the exact same activity (intensity of workload) that just caused the symptoms and see if they are substantially diminished or not after saba 3) if there is an activity that reproducibly causes the symptoms, try the saba 15 min before the activity on alternate days   If in fact the saba really does help, then fine to continue to use it prn but advised may need to look closer at the maintenance regimen (breztri  in this case) being used to achieve better control of airways disease with exertion.    >>> zpak/ pred x 6 days for acute bronchitis with active wheeze p 2 weeks viral URI

## 2024-08-22 NOTE — Patient Instructions (Addendum)
 Plan A = Automatic = Always=    Breztri  Take 2 puffs first thing in am and then another 2 puffs about 12 hours later.    Work on inhaler technique:  relax and gently blow all the way out then take a nice smooth full deep breath back in, triggering the inhaler at same time you start breathing in.  Hold breath in for at least  5 seconds if you can. Blow out Breztri   thru nose. Rinse and gargle with water  when done.  If mouth or throat bother you at all,  try brushing teeth/gums/tongue with arm and hammer toothpaste/ make a slurry and gargle and spit out.   >>>  Remember how golfers warm up by taking practice swings - do this with an empty inhaler    Plan B = Backup (to supplement plan A, not to replace it) Use your albuterol  inhaler as a rescue medication to be used if you can't catch your breath by resting or slowing your pace  or doing a relaxed purse lip breathing pattern.  - The less you use it, the better it will work when you need it. - Ok to use the inhaler up to 2 puffs  every 4 hours if you must but call for appointment if use goes up over your usual need - Don't leave home without it !!  (think of it like the spare tire or starter fluid for your car)   Also  Ok to try albuterol  15 min before an activity (on alternating days)  that you know would usually make you short of breath and see if it makes any difference and if makes none then don't take albuterol  after activity unless you can't catch your breath as this means it's the resting that helps, not the albuterol .   Please remember to go to the lab department   for your tests - we will call you with the results when they are available.      zpak  Prednisone  10 mg take  4 each am x 2 days,   2 each am x 2 days,  1 each am x 2 days and stop    Please schedule a follow up office visit in 6 weeks, call sooner if needed  PFTs

## 2024-08-22 NOTE — Assessment & Plan Note (Addendum)
 Counseled re importance of smoking cessation but did not meet time criteria for separate billing     Low-dose CT lung cancer screening is recommended for patients who are 66-62 years of age with a 20+ pack-year history of smoking and who are currently smoking or quit <=15 years ago. No coughing up blood  No unintentional weight loss of > 15 pounds in the last 6 months - pt is eligible for scanning yearly until 34 y p quits   Discussed in detail all the  indications, usual  risks and alternatives  relative to the benefits with patient who agrees to proceed with w/u as outlined.        Each maintenance medication was reviewed in detail including emphasizing most importantly the difference between maintenance and prns and under what circumstances the prns are to be triggered using an action plan format where appropriate.  Total time for H and P, chart review, counseling, reviewing hfa  device(s) , directly observing portions of ambulatory 02 saturation study/ and generating customized AVS unique to this office visit / same day charting = 46 min new pt eval

## 2024-09-04 ENCOUNTER — Encounter (HOSPITAL_COMMUNITY)

## 2024-09-07 ENCOUNTER — Encounter (HOSPITAL_COMMUNITY): Payer: Self-pay

## 2024-09-07 ENCOUNTER — Ambulatory Visit (HOSPITAL_COMMUNITY): Admit: 2024-09-07 | Admitting: Internal Medicine

## 2024-09-07 SURGERY — COLONOSCOPY
Anesthesia: Choice

## 2024-10-09 ENCOUNTER — Encounter (HOSPITAL_COMMUNITY)

## 2024-10-09 ENCOUNTER — Inpatient Hospital Stay: Payer: Self-pay

## 2024-10-10 ENCOUNTER — Ambulatory Visit: Admitting: Internal Medicine
